# Patient Record
Sex: Female | Born: 1935 | Race: White | Hispanic: No | Marital: Single
Health system: Midwestern US, Academic
[De-identification: ages and names within clinical notes are randomized; demographics above are authoritative.]

---

## 2017-03-30 ENCOUNTER — Encounter: Admit: 2017-03-30 | Discharge: 2017-03-30 | Payer: MEDICARE

## 2017-03-30 MED ORDER — METOPROLOL TARTRATE 25 MG PO TAB
ORAL_TABLET | ORAL | 2 refills | 90.00000 days | Status: AC
Start: 2017-03-30 — End: 2018-07-06

## 2017-04-17 LAB — BASIC METABOLIC PANEL
Lab: 14
Lab: 140
Lab: 66
Lab: 77

## 2017-04-17 LAB — THYROID STIMULATING HORMONE-TSH: Lab: 1.9

## 2017-04-17 LAB — LIPID PROFILE
Lab: 126 — ABNORMAL HIGH (ref <100)
Lab: 196
Lab: 28
Lab: 4
Lab: 50

## 2017-06-02 ENCOUNTER — Encounter: Admit: 2017-06-02 | Discharge: 2017-06-02 | Payer: MEDICARE

## 2017-06-02 ENCOUNTER — Ambulatory Visit: Admit: 2017-06-02 | Discharge: 2017-06-03 | Payer: MEDICARE

## 2017-06-02 DIAGNOSIS — I10 Essential (primary) hypertension: ICD-10-CM

## 2017-06-02 DIAGNOSIS — I4891 Unspecified atrial fibrillation: Principal | ICD-10-CM

## 2017-06-02 DIAGNOSIS — I4819 Other persistent atrial fibrillation: Principal | ICD-10-CM

## 2017-06-02 DIAGNOSIS — E785 Hyperlipidemia, unspecified: ICD-10-CM

## 2017-06-02 DIAGNOSIS — E039 Hypothyroidism, unspecified: ICD-10-CM

## 2017-06-02 DIAGNOSIS — G20 Parkinson's disease: ICD-10-CM

## 2017-06-04 ENCOUNTER — Encounter: Admit: 2017-06-04 | Discharge: 2017-06-04 | Payer: MEDICARE

## 2017-10-28 LAB — COMPREHENSIVE METABOLIC PANEL
Lab: 0.8 — ABNORMAL LOW (ref 33.0–37.0)
Lab: 1
Lab: 103 — ABNORMAL HIGH (ref 37.0–47.0)
Lab: 142
Lab: 16 — ABNORMAL HIGH (ref 27.0–31.0)
Lab: 18 — ABNORMAL HIGH (ref 0–14)
Lab: 20
Lab: 26 — ABNORMAL HIGH (ref 80.0–99.0)
Lab: 34
Lab: 4.4
Lab: 7.1
Lab: 71
Lab: 88
Lab: 9.5
Lab: 97

## 2017-10-28 LAB — THYROID STIMULATING HORMONE-TSH: Lab: 2.8 — ABNORMAL HIGH (ref 12.0–16.0)

## 2017-10-28 LAB — CBC: Lab: 6.4

## 2018-07-06 ENCOUNTER — Encounter: Admit: 2018-07-06 | Discharge: 2018-07-06 | Payer: MEDICARE

## 2018-07-06 ENCOUNTER — Ambulatory Visit: Admit: 2018-07-06 | Discharge: 2018-07-07 | Payer: MEDICARE

## 2018-07-06 DIAGNOSIS — E039 Hypothyroidism, unspecified: ICD-10-CM

## 2018-07-06 DIAGNOSIS — E782 Mixed hyperlipidemia: ICD-10-CM

## 2018-07-06 DIAGNOSIS — E785 Hyperlipidemia, unspecified: ICD-10-CM

## 2018-07-06 DIAGNOSIS — I4821 Permanent atrial fibrillation: Principal | ICD-10-CM

## 2018-07-06 DIAGNOSIS — I4891 Unspecified atrial fibrillation: Principal | ICD-10-CM

## 2018-07-06 DIAGNOSIS — G20 Parkinson's disease: ICD-10-CM

## 2018-07-06 DIAGNOSIS — I10 Essential (primary) hypertension: ICD-10-CM

## 2018-07-06 LAB — BASIC METABOLIC PANEL
Lab: 18 — ABNORMAL HIGH (ref 0–14)
Lab: 1.6 — ABNORMAL HIGH (ref 0.57–1.11)
Lab: 119 — ABNORMAL HIGH (ref 83–110)
Lab: 142
Lab: 3.7
Lab: 31 — ABNORMAL HIGH (ref 80.0–99.0)
Lab: 37 — ABNORMAL HIGH (ref 9.8–20.1)
Lab: 9.8
Lab: 97 — ABNORMAL LOW (ref 98–107)

## 2018-07-06 LAB — BNP (B-TYPE NATRIURETIC PEPTI): Lab: 147 — ABNORMAL HIGH (ref 0–100)

## 2018-07-06 LAB — CBC: Lab: 7.5

## 2018-07-07 MED ORDER — APIXABAN 2.5 MG PO TAB
2.5 mg | ORAL_TABLET | Freq: Two times a day (BID) | ORAL | 11 refills | Status: AC
Start: 2018-07-07 — End: 2018-12-16

## 2018-07-08 ENCOUNTER — Encounter: Admit: 2018-07-08 | Discharge: 2018-07-08 | Payer: MEDICARE

## 2018-07-13 ENCOUNTER — Encounter: Admit: 2018-07-13 | Discharge: 2018-07-13 | Payer: MEDICARE

## 2018-08-03 ENCOUNTER — Ambulatory Visit: Admit: 2018-08-03 | Discharge: 2018-08-04 | Payer: MEDICARE

## 2018-08-03 ENCOUNTER — Encounter: Admit: 2018-08-03 | Discharge: 2018-08-03 | Payer: MEDICARE

## 2018-08-03 DIAGNOSIS — I10 Essential (primary) hypertension: ICD-10-CM

## 2018-08-03 DIAGNOSIS — E785 Hyperlipidemia, unspecified: ICD-10-CM

## 2018-08-03 DIAGNOSIS — I4891 Unspecified atrial fibrillation: Principal | ICD-10-CM

## 2018-08-03 DIAGNOSIS — E039 Hypothyroidism, unspecified: ICD-10-CM

## 2018-08-03 DIAGNOSIS — I4821 Permanent atrial fibrillation: Principal | ICD-10-CM

## 2018-08-03 DIAGNOSIS — E782 Mixed hyperlipidemia: ICD-10-CM

## 2018-08-05 ENCOUNTER — Encounter: Admit: 2018-08-05 | Discharge: 2018-08-05 | Payer: MEDICARE

## 2018-08-05 DIAGNOSIS — E785 Hyperlipidemia, unspecified: ICD-10-CM

## 2018-08-05 DIAGNOSIS — I4891 Unspecified atrial fibrillation: Principal | ICD-10-CM

## 2018-08-05 DIAGNOSIS — E039 Hypothyroidism, unspecified: ICD-10-CM

## 2018-08-10 ENCOUNTER — Ambulatory Visit: Admit: 2018-08-10 | Discharge: 2018-08-11 | Payer: MEDICARE

## 2018-08-10 ENCOUNTER — Encounter: Admit: 2018-08-10 | Discharge: 2018-08-10 | Payer: MEDICARE

## 2018-08-10 DIAGNOSIS — R079 Chest pain, unspecified: Principal | ICD-10-CM

## 2018-08-10 DIAGNOSIS — R600 Localized edema: ICD-10-CM

## 2018-08-10 DIAGNOSIS — I499 Cardiac arrhythmia, unspecified: ICD-10-CM

## 2018-08-10 IMAGING — US ECHOCOMPL
1 series · 14 of 24 positions shown · non-contrast
Comparison: none

[Series 1: us echo 2d, wo/w m-mode, compl · 99 acquisitions, 14 frames shown]
[im 1/99]
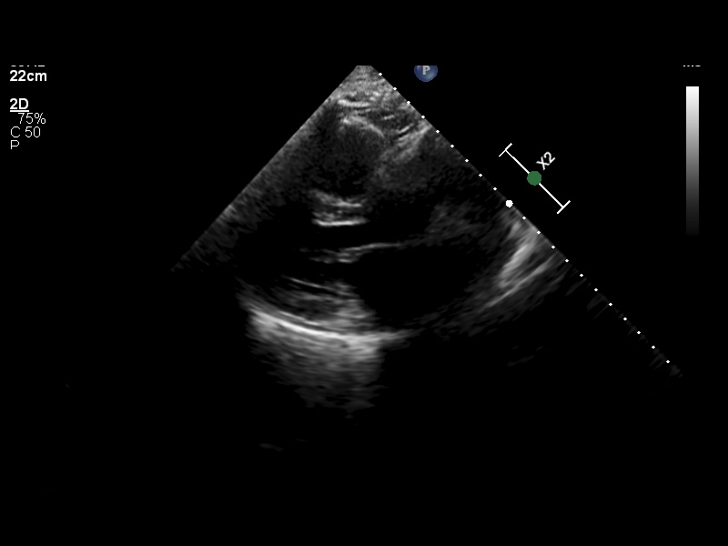
[im 5/99]
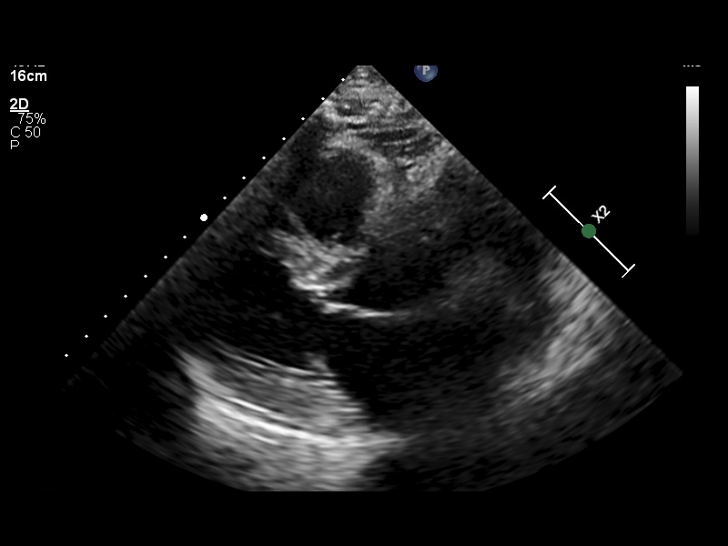
[im 13/99]
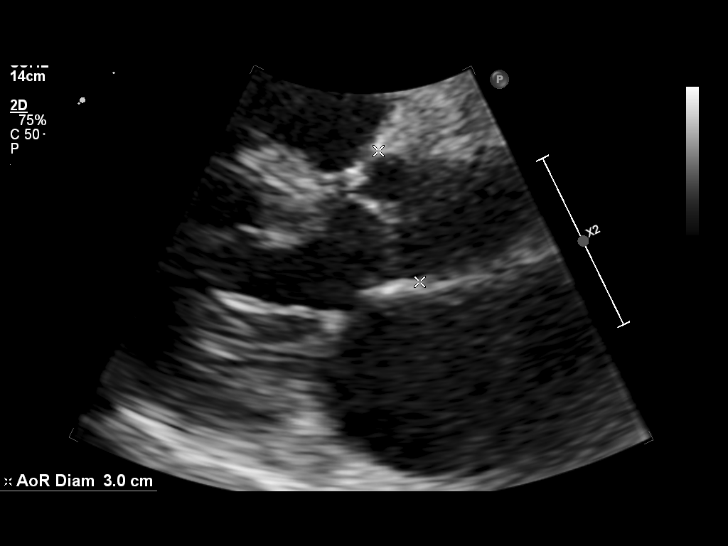
[im 26/99]
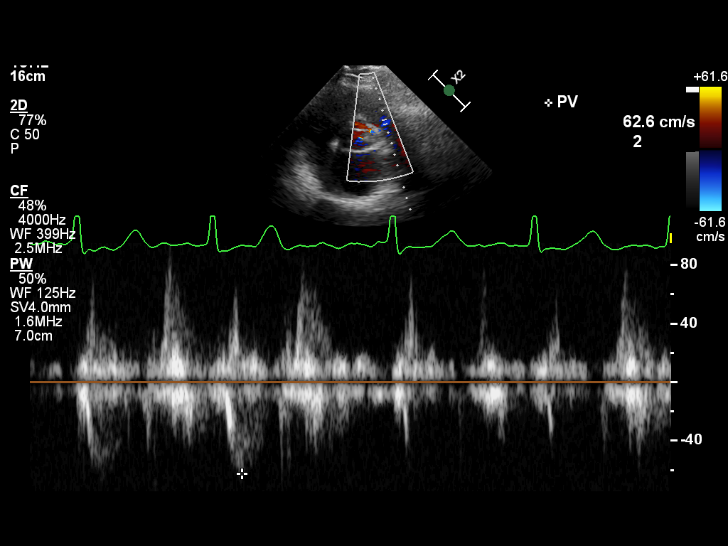
[im 30/99]
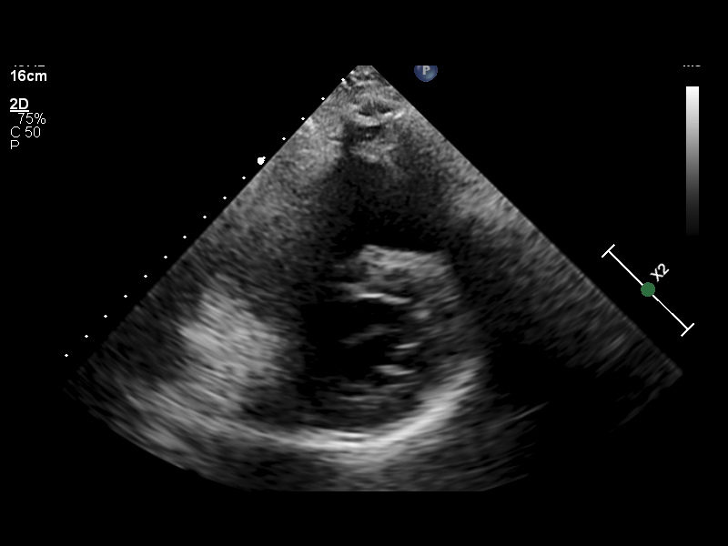
[im 39/99]
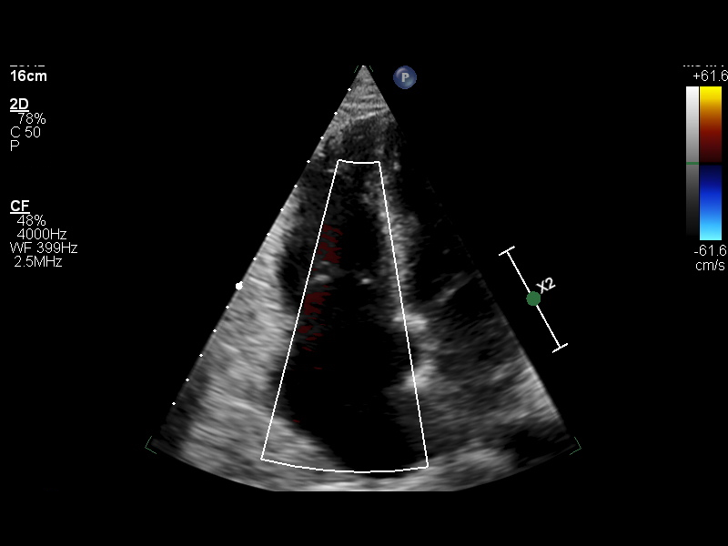
[im 47/99]
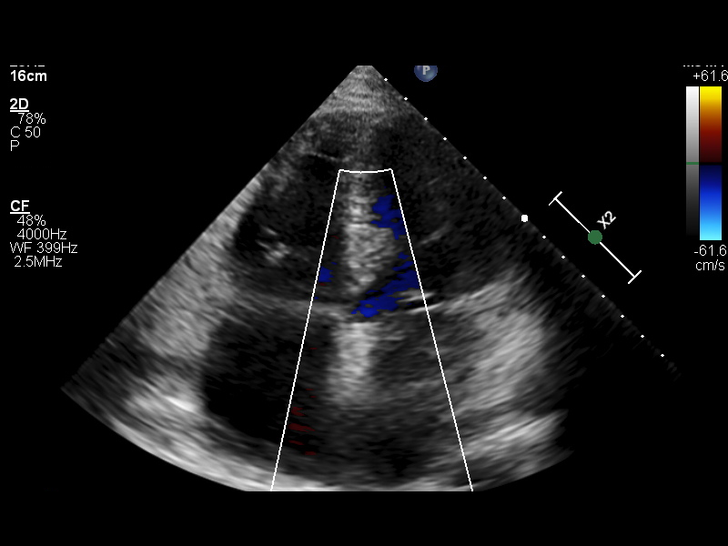
[im 47/99]
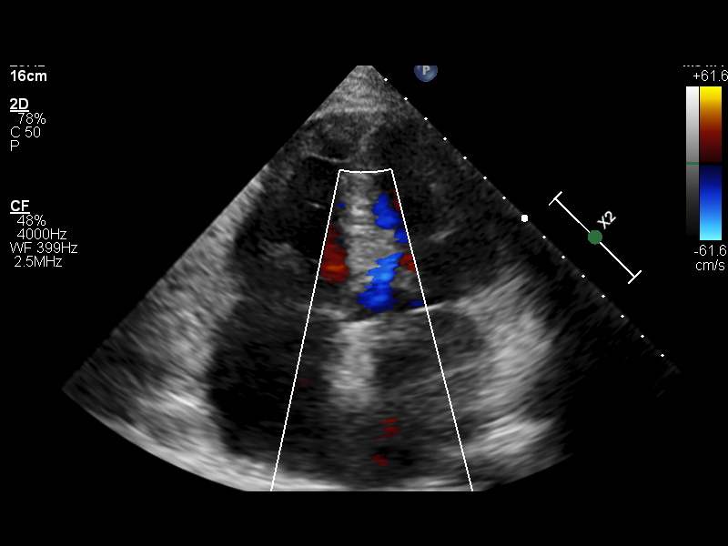
[im 60/99]
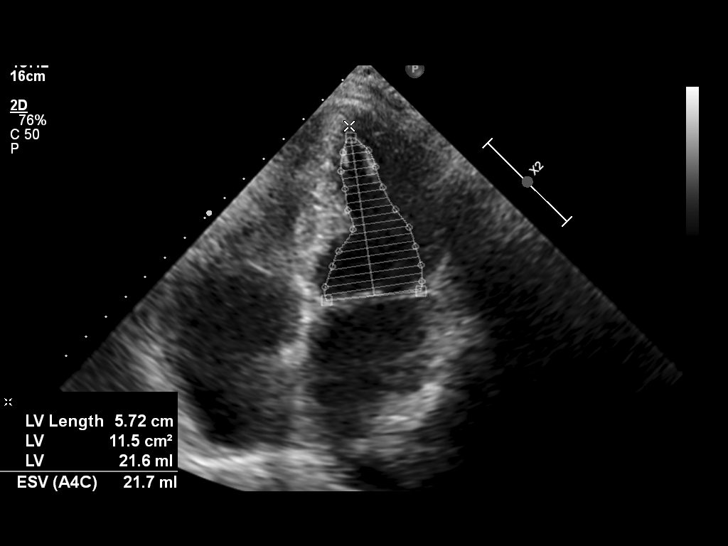
[im 69/99]
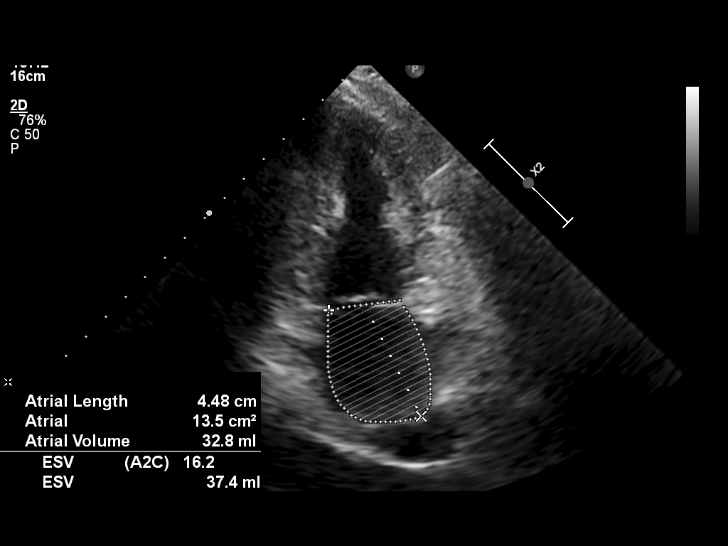
[im 77/99]
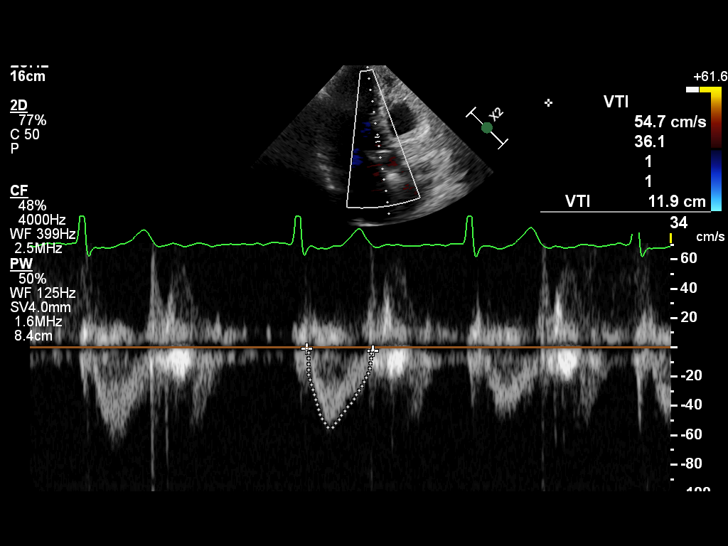
[im 81/99]
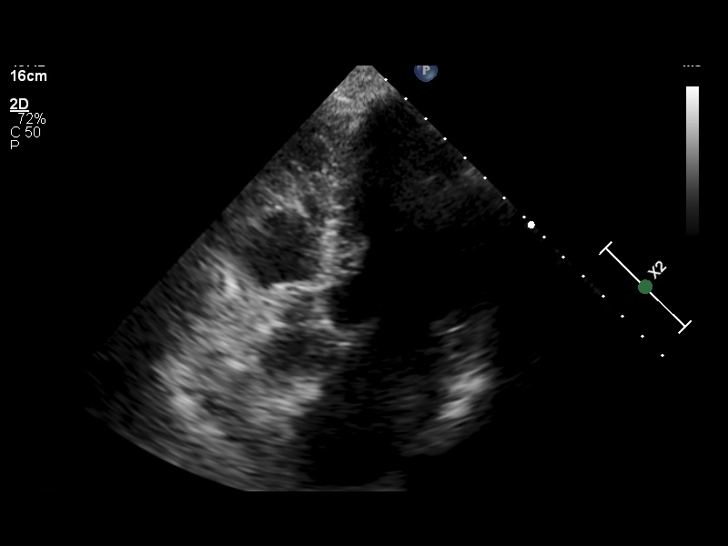
[im 90/99]
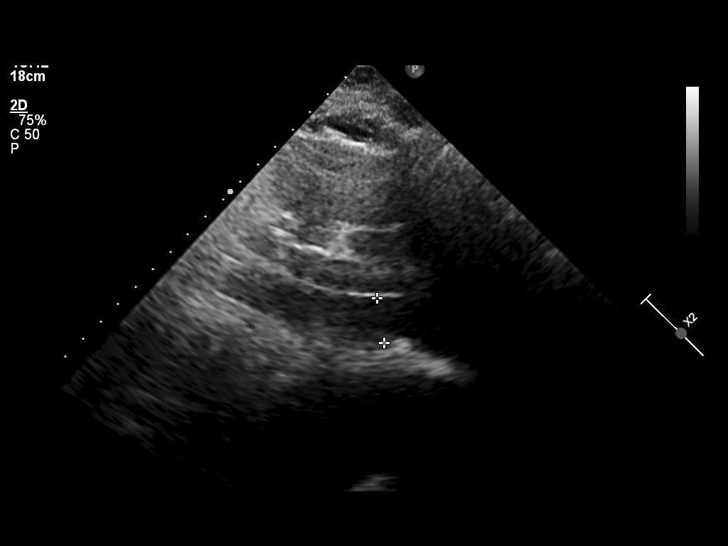
[im 99/99]
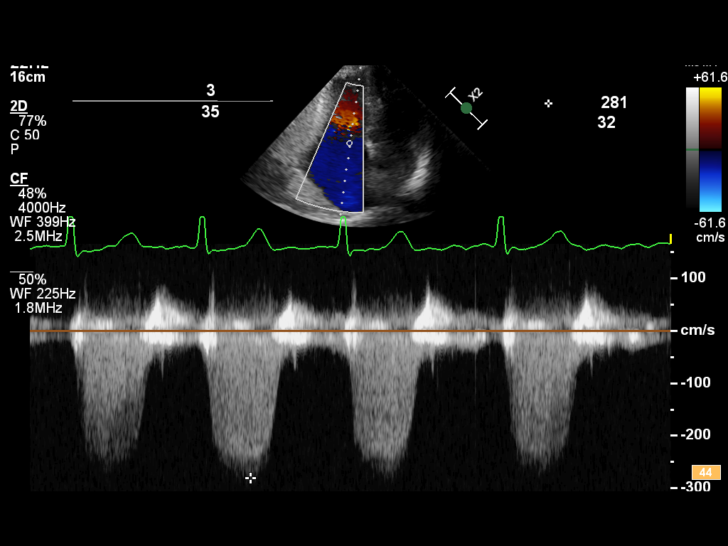

[14 of 24 positions shown; findings below may reference images not displayed]

FINAL REPORT IS SCANNED IN THE PATIENT'S EMR.

Tech Notes:

jl

## 2018-09-30 ENCOUNTER — Encounter: Admit: 2018-09-30 | Discharge: 2018-09-30 | Payer: MEDICARE

## 2018-12-16 ENCOUNTER — Encounter: Admit: 2018-12-16 | Discharge: 2018-12-16 | Payer: MEDICARE

## 2018-12-16 LAB — COMPREHENSIVE METABOLIC PANEL
Lab: 0.9
Lab: 1.1
Lab: 100
Lab: 28
Lab: 4.5
Lab: 47
Lab: 6
Lab: 7.3
Lab: 77
Lab: 141
Lab: 20
Lab: 28
Lab: 9.8
Lab: 90

## 2018-12-16 LAB — THYROID STIMULATING HORMONE-TSH
Lab: 0.2
Lab: 0

## 2018-12-16 LAB — CBC: Lab: 6.2

## 2018-12-16 MED ORDER — APIXABAN 5 MG PO TAB
5 mg | Freq: Two times a day (BID) | ORAL | 0 refills | Status: AC
Start: 2018-12-16 — End: ?

## 2018-12-16 NOTE — Progress Notes
Received fax from [facility],  [place], [place] [phone], Fx [phone]     Page w/ request for dose review from [facility], [place] [phone]  Fx [phone].     Pt is currently on Eliquis 2.5mg  bid, when it was prescribed in November 2019 Creat was 1.6. Prior to that it had been 0.8.     Creat on 10/27/18 is 1.17 (lab entered) so would be appropriate to increase to the 5mg  bid dose.     MAA reviewed, wrote on pharmacy form to change to 5mg  bid and repeat BMP in 1 mo. I faxed this information to the [facility].   Updated meds list    Lab entered for historical purpose.

## 2019-04-26 ENCOUNTER — Encounter: Admit: 2019-04-26 | Discharge: 2019-04-26

## 2019-05-03 ENCOUNTER — Ambulatory Visit: Admit: 2019-05-03 | Discharge: 2019-05-04

## 2019-05-03 ENCOUNTER — Encounter: Admit: 2019-05-03 | Discharge: 2019-05-03

## 2019-05-03 DIAGNOSIS — I4821 Permanent atrial fibrillation: Secondary | ICD-10-CM

## 2019-05-03 DIAGNOSIS — E039 Hypothyroidism, unspecified: Secondary | ICD-10-CM

## 2019-05-03 DIAGNOSIS — G20 Parkinson's disease: Secondary | ICD-10-CM

## 2019-05-03 DIAGNOSIS — E782 Mixed hyperlipidemia: Secondary | ICD-10-CM

## 2019-05-03 DIAGNOSIS — I4891 Unspecified atrial fibrillation: Secondary | ICD-10-CM

## 2019-05-03 DIAGNOSIS — I071 Rheumatic tricuspid insufficiency: Secondary | ICD-10-CM

## 2019-05-03 DIAGNOSIS — E785 Hyperlipidemia, unspecified: Secondary | ICD-10-CM

## 2019-05-03 DIAGNOSIS — I10 Essential (primary) hypertension: Secondary | ICD-10-CM

## 2019-09-13 ENCOUNTER — Encounter: Admit: 2019-09-13 | Discharge: 2019-09-13 | Payer: MEDICARE

## 2019-09-13 DIAGNOSIS — I4891 Unspecified atrial fibrillation: Secondary | ICD-10-CM

## 2019-09-13 DIAGNOSIS — I4821 Permanent atrial fibrillation: Secondary | ICD-10-CM

## 2019-09-13 DIAGNOSIS — E785 Hyperlipidemia, unspecified: Secondary | ICD-10-CM

## 2019-09-13 DIAGNOSIS — I10 Essential (primary) hypertension: Secondary | ICD-10-CM

## 2019-09-13 DIAGNOSIS — G20 Parkinson's disease: Secondary | ICD-10-CM

## 2019-09-13 DIAGNOSIS — E039 Hypothyroidism, unspecified: Secondary | ICD-10-CM

## 2019-09-13 DIAGNOSIS — Z7901 Long term (current) use of anticoagulants: Secondary | ICD-10-CM

## 2019-09-13 DIAGNOSIS — E782 Mixed hyperlipidemia: Secondary | ICD-10-CM

## 2019-09-13 DIAGNOSIS — I071 Rheumatic tricuspid insufficiency: Secondary | ICD-10-CM

## 2019-09-13 NOTE — Progress Notes
Date of Service: 09/13/2019    [facility] Durene Foyt is a 86 y.o. female.       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    She does have permanent atrial fibrillation, her rate is controlled with a beta-blocker.  She also has Parkinson's disease and Alzheimer's disease entities are currently treated with Sinemet as well as Aricept.    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  She has been able to do that without any difficulty whatsoever.    Patient does not have any documented history of coronary artery disease.    She was evaluated with an echocardiogram in December 2019, the left ventricular systolic function was normal, she was found at the time to have moderate tricuspid valve regurgitation, it was no other significant valvular abnormality.         Vitals:    09/13/19 1608   BP: 128/72   BP Source: Arm, Left Upper   Patient Position: Sitting   Pulse: 82   Temp: 36.7 ?C (98.1 ?F)   TempSrc: Oral   SpO2: 98%   Weight: 82.4 kg (181 lb 9.6 oz)   Height: 1.753 m (5' 9)   PainSc: Zero     Body mass index is 26.82 kg/m?Marland Kitchen     Past Medical History  Patient Active Problem List    Diagnosis Date Noted   ? Moderate tricuspid valve regurgitation 05/03/2019   ? Parkinson's disease (tremor, stiffness, slow motion, unstable posture) (HCC) 05/20/2016   ? Essential hypertension 06/06/2014   ? Atrial fibrillation (HCC) 12/16/2011   ? Hypothyroid 12/16/2011   ? Hyperlipemia 12/16/2011         Review of Systems   Constitution: Negative.   HENT: Negative.    Eyes: Negative.    Cardiovascular: Negative.    Respiratory: Negative.    Endocrine: Negative.    Hematologic/Lymphatic: Negative.    Skin: Negative.    Musculoskeletal: Negative.    Gastrointestinal: Negative. Genitourinary: Negative.    Neurological: Negative.    Psychiatric/Behavioral: Negative.    Allergic/Immunologic: Negative.        Physical Exam  General Appearance: normal in appearance  Skin: warm, moist, no ulcers or xanthomas  Eyes: conjunctivae and lids normal, pupils are equal and round  Lips & Oral Mucosa: no pallor or cyanosis  Neck Veins: neck veins are flat, neck veins are not distended  Chest Inspection: chest is normal in appearance  Respiratory Effort: breathing comfortably, no respiratory distress  Auscultation/Percussion: lungs clear to auscultation, no rales or rhonchi, no wheezing  Cardiac Rhythm: irregularly irregular rhythm and normal rate  Cardiac Auscultation: S1, S2 normal, no rub, no gallop  Murmurs: no murmur  Carotid Arteries: normal carotid upstroke bilaterally, no bruit  Abdominal Aorta: no abdominal aortic bruit  Lower Extremity Edema: no lower extremity edema  Abdominal Exam: soft, non-tender, no masses, bowel sounds normal  Liver & Spleen: no organomegaly  Neurologic Exam: neurological assessment grossly intact         Cardiovascular Studies  Twelve-lead EKG demonstrates atrial fibrillation, ventricular rate 88 bpm    Problems Addressed Today  Encounter Diagnoses   Name Primary?   ? Permanent atrial fibrillation (HCC) Yes   ?  Mixed hyperlipidemia    ? Essential hypertension    ? Moderate tricuspid valve regurgitation    ? Hypothyroidism, unspecified type    ? Parkinson's disease (tremor, stiffness, slow motion, unstable posture) (HCC)    ? On apixaban therapy        Assessment and Plan     In summary: Patient is an 86 year old retired [place] nun with a history of permanent atrial fibrillation, patient is on anticoagulation with apixaban, her heart rate is well controlled, other comorbidities include: Hyperlipidemia, Alzheimer disease and Parkinson's disease, moderate TR by an echocardiogram performed in December 2019.  Patient does not have any documented history of CAD.    Plan: 1.  Continue all current medications  2.  I did recommend low-sodium diet?today on the physical exam she did have bilateral lower extremity edema pretibially  3.  I also recommend weight reduction of 3-5 pounds  4.  Patient will be evaluated with an echocardiogram and office visit in April May 2021.         Current Medications (including today's revisions)  ? apixaban (ELIQUIS) 5 mg tablet Take one tablet by mouth twice daily.   ? aspirin 81 mg chewable tablet Take 81 mg by mouth daily.     ? calcium carbonate/vitamin D-3 (OSCAL-500+D) 1250 mg/200 unit tablet Take 1 tablet by mouth daily. Calcium Carb 1250mg  delivers 500mg  elemental Ca   ? carbidopa/levodopa (SINEMET) 25/100 mg tablet Take 1 tablet by mouth four times daily.   ? donepezil (ARICEPT) 10 mg tablet Take 10 mg by mouth at bedtime daily.   ? glucosamine 500 mg tab Take 1,500 mg by mouth daily with breakfast.   ? levothyroxine (SYNTHROID) 75 mcg tablet Take 75 mcg by mouth daily 30 minutes before breakfast.   ? memantine (NAMENDA) 5 mg tablet Take 5 mg by mouth twice daily.   ? metoprolol XL (TOPROL XL) 25 mg extended release tablet Take 25 mg by mouth daily.   ? potassium chloride SR (K-DUR) 20 mEq tablet Take 20 mEq by mouth daily. Take with a meal and a full glass of water.   ? rOPINIRole (REQUIP) 2 mg tablet Take 2 mg by mouth three times daily.   ? simvastatin (ZOCOR) 20 mg tablet Take 20 mg by mouth at bedtime daily.     ? tiZANidine (ZANAFLEX) 2 mg capsule Take 2 mg by mouth three times daily.   ? torsemide(+) (DEMADEX) 20 mg tablet Take 40 mg by mouth daily.   ? zinc sulfate 220 mg (50 mg elemental zinc) capsule Take 220 mg by mouth daily.

## 2019-12-12 IMAGING — US ECHOCOMPL
1 series · 13 of 24 positions shown · non-contrast
Comparison: none

[Series 1: us echo 2d, wo/w m-mode, compl · 39 acquisitions, 13 frames shown]
[im 1/39]
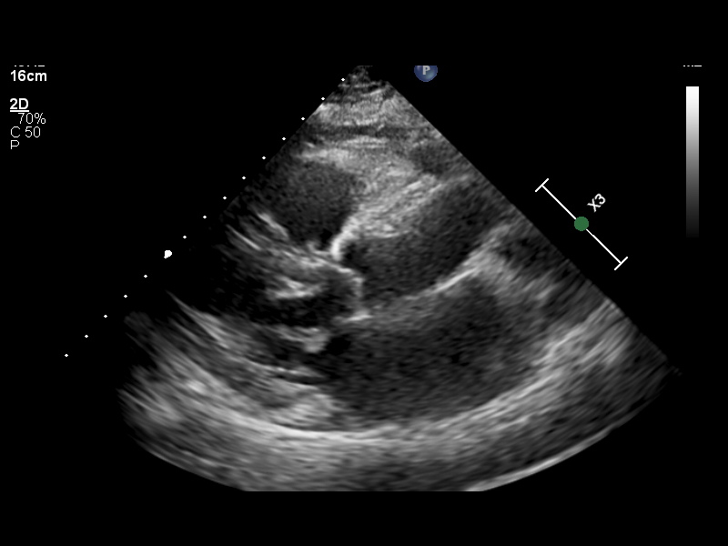
[im 2/39]
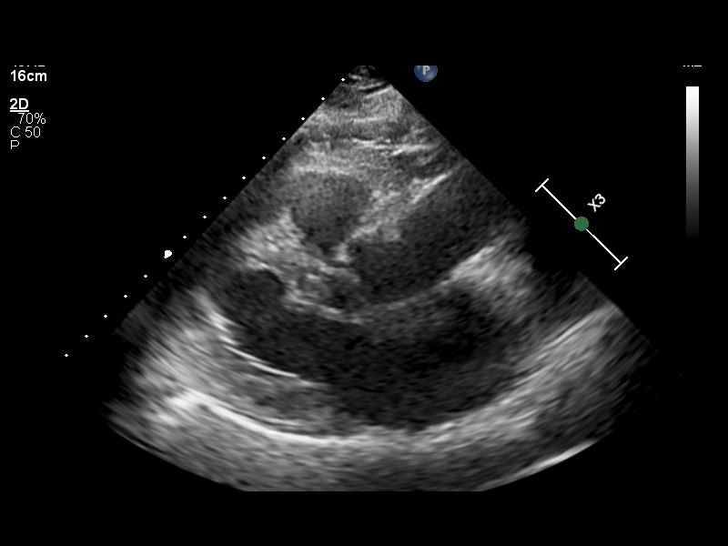
[im 5/39]
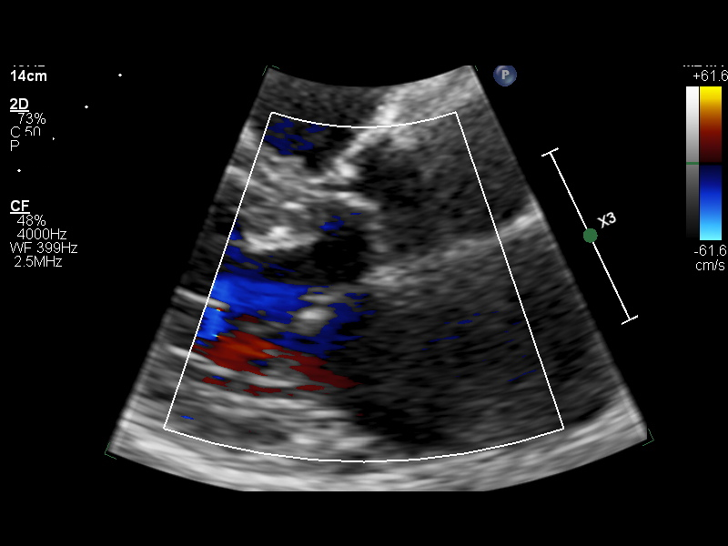
[im 9/39]
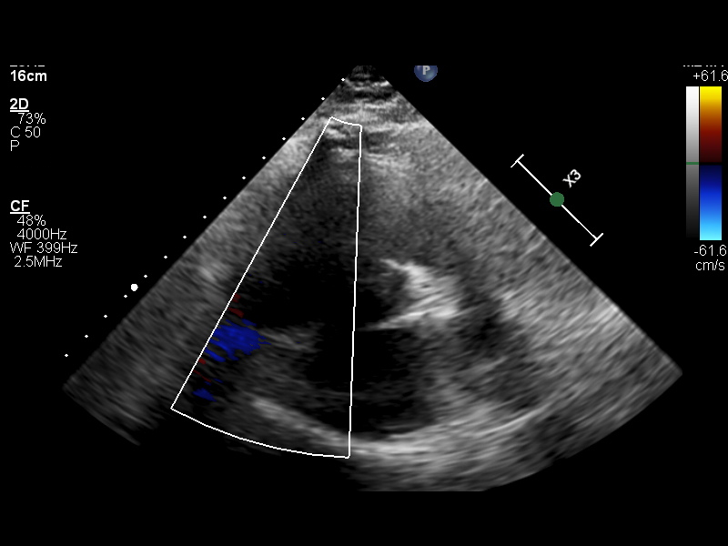
[im 12/39]
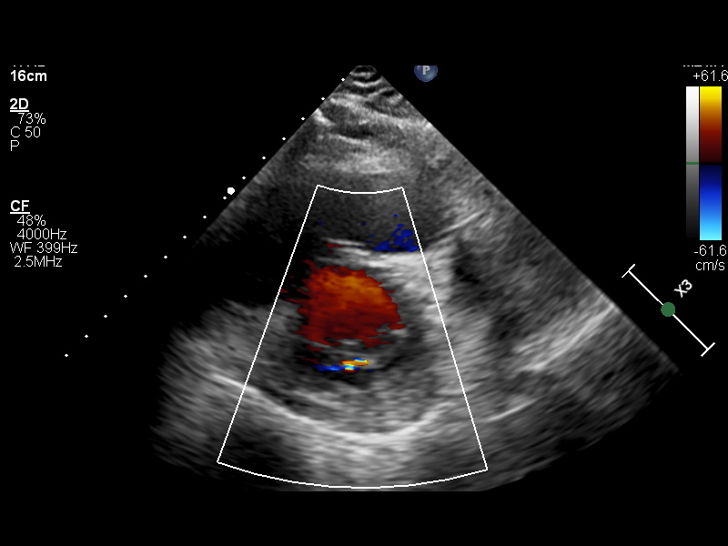
[im 15/39]
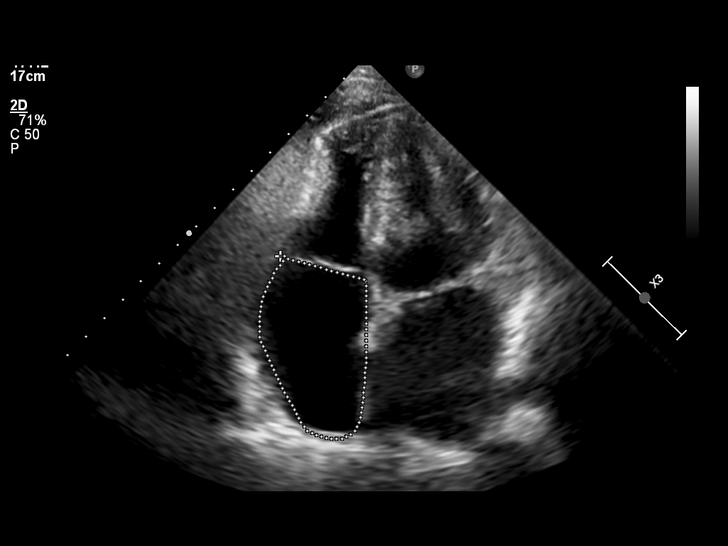
[im 19/39]
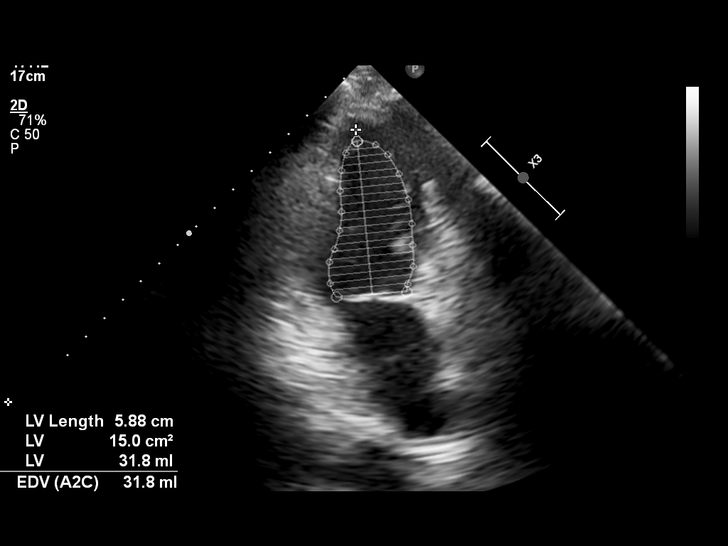
[im 22/39]
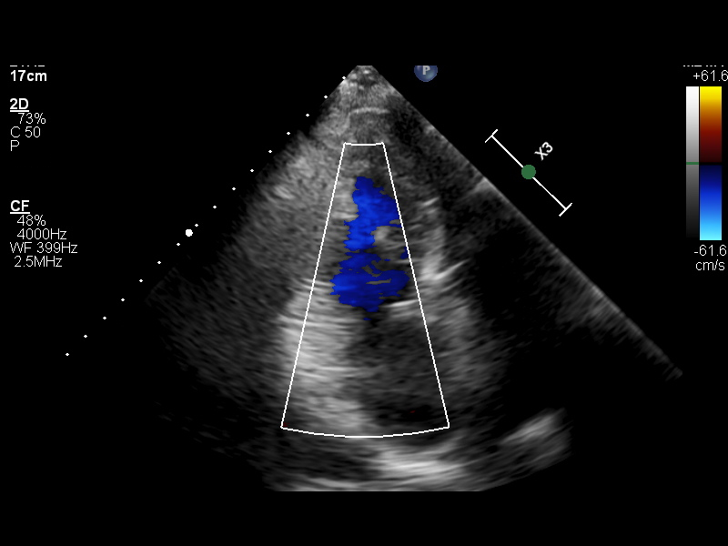
[im 24/39]
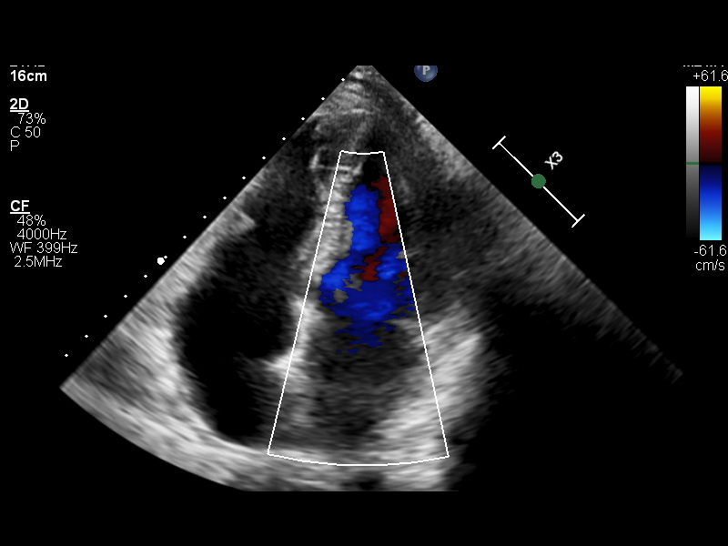
[im 27/39]
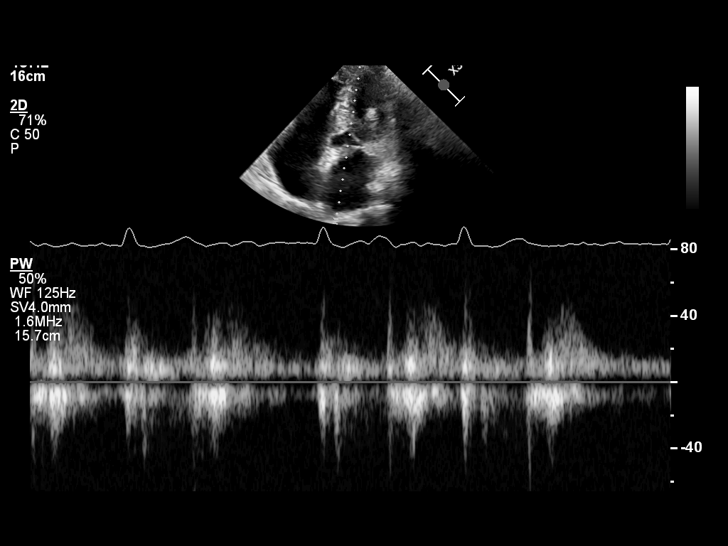
[im 30/39]
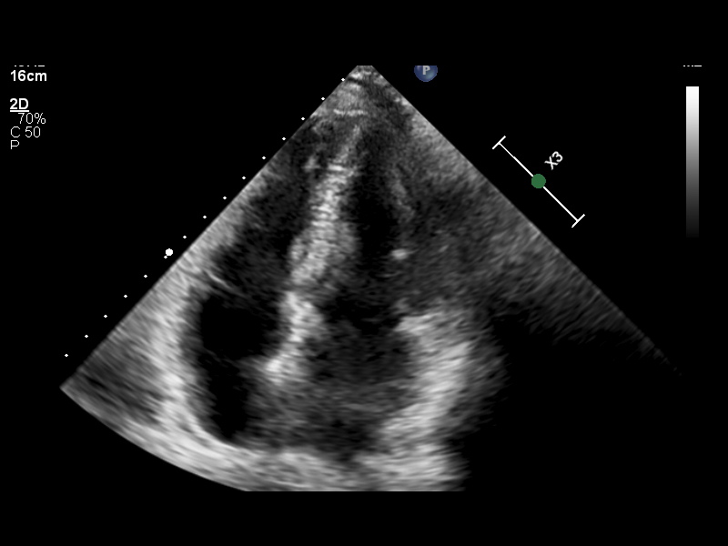
[im 34/39]
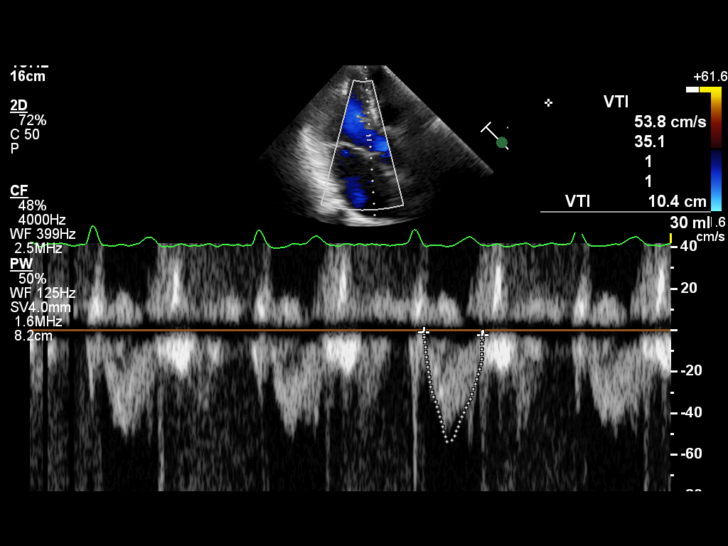
[im 39/39]
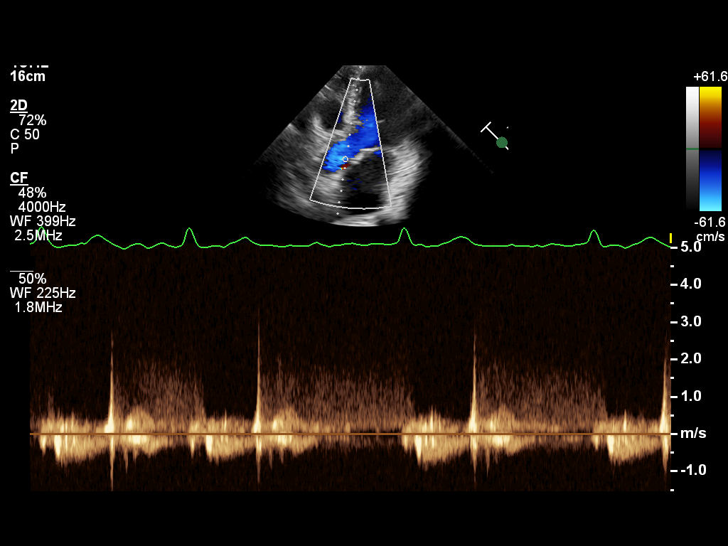

[13 of 24 positions shown; findings below may reference images not displayed]

12/12/19 -  2D + DOPPLER ECHO
Location Performed: [HOSPITAL]

Referring Provider: Jhon Elver Arrollave
Amazigh:
Location of Interp:
Sonographer: External Staff
Machine:  [employer]

Indications:      Hypertension     Hyperlipidemia, Moderate TR

Vitals
Height   Weight   BSA (Calculated)   BP   Comments
175.3 cm (69")   88.5 kg (195 lb)   2.07   118/69

Interpretation Summary
Normal left ventricular systolic function with an EF of 65%.
Moderate right atrial enlargement
Normal right ventricular size and function; RV s' 10 cm/s, TAPSE 12 mm
Estimated peak systolic PA pressure =  33 mmHg
Mild sclerosis of the aortic valve without stenosis.  Trace regurgitation
Mild to moderate tricuspid valve regurgitation
No pericardial effusion

No prior echocardiograms for comparison.

Echocardiographic Findings
Left Ventricle   The left ventricular systolic function is normal. The visually estimated ejection
fraction is 65%.
Right Ventricle   The right ventricular size, wall thickness and systolic function are normal.
Left Atrium   Normal size.
Right Atrium   Moderately dilated.
IVC/SVC   Elevated central venous pressure (5-10 mm Hg).
Mitral Valve   Normal valve structure. No stenosis. Trace regurgitation.
Tricuspid Valve   Normal valve structure. No stenosis. Mild to moderate regurgitation.
Aortic Valve   The valve has focal thickening. No stenosis. Trace regurgitation.
Pulmonary   The pulmonic valve was not well seen.
Aorta   The aortic root and the visualized portion of the proximal ascending aorta are normal in
diameter indexed for the patient's body surface area.
Pericardium   No pericardial effusion.

Left Heart 2D Measurements (Normal Ranges)
EF (Visual)
65 %
EF (Simpson's)
73 %
LVIDD
3.8 cm (Range: 3.8 - 5.2)
LVIDS
2.0 cm (Range: 2.2 - 3.5)
IVS
1.1 cm (Range: 0.6 - 0.9)
LV PW
1.2 cm (Range: 0.6 - 0.9)
LA Size
3.8 cm (Range: 2.7 - 3.8)

Right Heart 2D   M-Mode Measurements (Normal Ranges) (Range)
RV Basal Dia
3.7 cm (2.5 - 4.1)
RV Mid Dia
2.8 cm (1.9 - 3.5)
MAZUR
22.0 cm2 (<18)
M-Mode TAPSE
1.2 cm (>1.7)

Left Heart 2D Addnl Measurements (Normal Ranges)
LV Systolic Vol
10 mL (Range: 14 - 42)
LV Systolic Vol Index
5 mL (Range: 8 - 24)
LV Diastolic Vol
37 mL (Range: 46 - 106)
LV Diastolic Vol Index
18 mL (Range: 29 - 61)
LA Vol
38 mL (Range: 22 - 52)
LA Vol Index
18.36 (Range: 16 - 34)
LV Mass
144 g (Range: 67 - 162)
LV Mass Index
69 g/m2 (Range: 43 - 95)
RWT
0.63 (Range: <=0.42)

Aortic Root Measurements (Normal Ranges)
Sinus
3.2 cm (Range: 2.4 - 3.6)
ADONAY MAR
3.1 cm

Doppler (Spectral and Color Flow)
Estimated Peak Systolic PA Pressure
Aortic valve peak velocity
0.8 m/s
Aortic valve regurgitation pressure halftime
759 msec

Tech Notes:

## 2019-12-13 ENCOUNTER — Encounter: Admit: 2019-12-13 | Discharge: 2019-12-13 | Payer: MEDICARE

## 2019-12-19 NOTE — Telephone Encounter
-----   Message from Dorris Fetch, MD sent at 12/18/2019  5:48 PM CDT -----  Please call the patient and let her know that the echocardiogram showed normal heart function.Thank you  ----- Message -----  From: Marcell Barlow, MD  Sent: 12/13/2019   5:18 PM CDT  To: Dorris Fetch, MD

## 2019-12-19 NOTE — Telephone Encounter
Discussed echo results with Lafonda Mosses, charge nurse at patient's residential facility. She will speak with patient.

## 2020-04-30 ENCOUNTER — Encounter: Admit: 2020-04-30 | Discharge: 2020-04-30 | Payer: MEDICARE

## 2020-05-02 ENCOUNTER — Encounter: Admit: 2020-05-02 | Discharge: 2020-05-02 | Payer: MEDICARE

## 2020-05-15 ENCOUNTER — Encounter: Admit: 2020-05-15 | Discharge: 2020-05-15 | Payer: MEDICARE

## 2020-05-18 ENCOUNTER — Encounter: Admit: 2020-05-18 | Discharge: 2020-05-18 | Payer: MEDICARE

## 2020-05-18 DIAGNOSIS — E039 Hypothyroidism, unspecified: Secondary | ICD-10-CM

## 2020-05-18 DIAGNOSIS — E785 Hyperlipidemia, unspecified: Secondary | ICD-10-CM

## 2020-05-18 DIAGNOSIS — Z7901 Long term (current) use of anticoagulants: Secondary | ICD-10-CM

## 2020-05-18 DIAGNOSIS — I071 Rheumatic tricuspid insufficiency: Secondary | ICD-10-CM

## 2020-05-18 DIAGNOSIS — I10 Essential (primary) hypertension: Secondary | ICD-10-CM

## 2020-05-18 DIAGNOSIS — I4891 Unspecified atrial fibrillation: Secondary | ICD-10-CM

## 2020-05-18 DIAGNOSIS — I4821 Permanent atrial fibrillation: Secondary | ICD-10-CM

## 2020-05-18 DIAGNOSIS — E782 Mixed hyperlipidemia: Secondary | ICD-10-CM

## 2020-05-18 DIAGNOSIS — G20 Parkinson's disease: Secondary | ICD-10-CM

## 2020-05-18 NOTE — Progress Notes
Date of Service: 05/18/2020    [facility] Melinda Mcdaniel is a 86 y.o. female.       HPI     Melinda Mcdaniel was seen in our office today for routine evaluation.  She is accompanied today by her friend, Melinda Mcdaniel. 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 Nickolas Madrid.  The patient has a medical history significant for atrial fibrillation rate controlled with beta-blocker, hypertension, Parkinson's disease and Alzheimer's disease currently treated with Sinemet as well as Aricept.  Does not have any documented history of CAD.    Melinda Mcdaniel was last seen in our office on 09/13/2019 by Dr. Avie Arenas.  No changes were made in her medications.  She was recommended to be on a low-sodium diet and to try to lose 3 to 5 pounds.  She was asked to get an updated echo in April.    2D echo Doppler performed on 12/12/2019 shows normal left ventricular systolic function with EF of 65%, moderate right atrial enlargement.  Normal right ventricular size and systolic function.  PA systolic pressure 33 mmHg.  Mild aortic valve sclerosis without stenosis, trace regurgitation.  Mild to moderate tricuspid valve regurgitation.    Today the patient reports no complaints.  She tells me she is getting along quite well.  She remains physically active and tries to walk every day although she tells me she is not a power walker.  She is trying to get in almost 30 minutes of nonstop walking daily. She reports that she can walk either in the building or is able to walk outside weather  permitting.  She is able to get around without any difficulty whatsoever. She does complain of hip pain and had a recent steroid injection by Dr. Herschell Dimes who is the physician for [facility] health care facility where the patient resides in [place].  He denies having chest pain, shortness of breath, exertional dyspnea, PND, orthopnea, cough and lower extremity edema.  She denies having palpitations, racing heartbeats, tinnitus and lightheadedness.  She has not had any near syncope, syncopal episodes or falls.  She remains anticoagulated apixaban 5 mg twice daily, denies any bleeding problems or complications.  She is compliant taking her medications.  She tells me she is maintaining a low-sodium diet.  Blood pressure today 140/82.  Prior to discharge from the office her blood pressure was 128/72 manually.  Today she was 175 pounds which is down 6 pounds from her last visit in January.    Assessment and Plan:   1.  Atrial fibrillation.  Patient is on anticoagulation with apixaban.  Her heart rate is well controlled on metoprolol XL 25 mg daily.  Her last echo performed on 12/12/2019 shows normal left ventricular systolic function with mild to moderate TR.  2.  Hyperlipidemia.  Treated.  3.  Alzheimer's disease.  4.  Parkinson's disease.  5.  Pretension.  Blood pressure initially was initially a bit elevated today in the office but after approximately 20 today she was 175 pounds which is down 6 pounds from her weight in February of this year.  After approximately 20 minutes of rest I got a manual reading of 128/72.  Of note is that patient did not take her medications prior to her visit today.  I asked her to take  her medicines as soon as she returns home this morning.  6. Mild to moderate tricuspid valve regurgitation.  There has been  no progression of her tricuspid valve insufficiency.  7.  Lower extremity edema.  Controlled.  She does not have any peripheral edema on examination today.  She continues on torsemide 40 mg daily.    -Continue current medications.  Continue current medical therapy  ?Asked patient to check blood pressure daily and keep a diary.  ?Reviewed discussed blood pressure goals, target less than 135/80.  ?Instructed to call our office in 2 weeks if she is often having systolic readings above 140 or diastolic above 90.  -Risk factors modification.  ?Stressed importance of a low sodium diet.   -Continue regular exercise.  Encouraged patient continue to get at least 30 minutes of walking/moderate aerobic exercise 5 days a week.  -Applauded patient for her weight reduction encouraged her to try to lose about 3 more  pounds.    Follow Up: Follow-up with Dr. Avie Arenas in 6  months.  Patient is encouraged to contact our office if she has problems prior to next visit    I have educated the patient on the plan of care today.  Patient verbally expressed understanding and agreement with plan.  Instructions are outlined in the after visit summary document.     Thank you for the opportunity to participate in this pleasant patient's care. Please don't hesitate to contact me with any concerns or questions.        DISEASE PREVENTION:  Lipids:Treated. On simvastatin 20 mg daily. No known CAD or diabetes.  Lipid profile 02/27/2020: Total cholesterol 195, triglycerides 187, HDL 53, LDL 105.Target LDL < 100, triglycerides < 150.       HTN:  Controlled.  Blood pressure today 140/82.  Blood pressure upon recheck approximately 15 minutes later was 128/72.     Diabetes No.      Tobacco: Denies use.      Obesity: BMI 25.9. A heart healthy diet and routine aerobic exercise are recommended.        Patient  education/counseling today: Reviewed medication instructions, medication interactions, treatment options, reviewed records including most recent lab and cardiac test results, lipid goals, diet, exercise, blood pressure monitoring, blood pressure goals,, weight reduction, follow-up plan.                         DRB  Vitals:    05/18/20 0759 05/18/20 0812   BP: (!) 144/84 (!) 140/82   BP Source: Arm, Left Upper Arm, Right Upper   Patient Position: Sitting Sitting   Pulse: 64    SpO2: 98%    Weight: 79.7 kg (175 lb 12.8 oz)    Height: 1.753 m (5' 9)    PainSc: Zero      Body mass index is 25.96 kg/m?Marland Kitchen     Past Medical History  Patient Active Problem List    Diagnosis Date Noted   ? On apixaban therapy 09/13/2019   ? Moderate tricuspid valve regurgitation 05/03/2019   ? Parkinson's disease (tremor, stiffness, slow motion, unstable posture) (HCC) 05/20/2016   ? Essential hypertension 06/06/2014   ? Atrial fibrillation (HCC) 12/16/2011   ? Hypothyroid 12/16/2011   ? Hyperlipemia 12/16/2011         Review of Systems   Constitutional: Negative.   HENT: Negative.    Eyes: Negative.    Cardiovascular: Negative.    Respiratory: Negative.    Endocrine: Negative.    Hematologic/Lymphatic: Negative.    Skin: Negative.    Musculoskeletal: Positive for joint pain and myalgias.  Gastrointestinal: Negative.    Genitourinary: Negative.    Neurological: Positive for tremors.   Psychiatric/Behavioral: Negative.    Allergic/Immunologic: Negative.        Physical Exam  Vital signs were reviewed.   General Appearance:appears well nourished, appears relaxed, in no acute distress,wearing a face mask  Skin: warm, moist, intact, no rash or lesions, no xanthomas, horizontal neck scar from previous thyroidectomy  HEENT: unremarkable, pupils equal and round, no scleral icterus, conjunctivae and lids normal  Lips & Mouth: no pallor or cyanosis  Neck Veins:  BP normal, neck veins are flat, neck veins are not distended   Carotid Arteries: normal carotid upstroke bilaterally, no bruits bilaterally  Chest Inspection: chest is normal in appearance  Auscultation/Percussion/Effort: lungs clear to auscultation, no rales, rhonchi, or wheezing, respirations even and unlabored, no respiratory distress  Cardiac Rhythm:  irregularly irregular rhythm and normal rate  Cardiac Auscultation: normal S1 & S2, no S3 or S4, no rub   Murmurs: no cardiac murmurs   Extremities: no lower extremity edema bilaterally, 2+ symmetric distal pulses   Abdominal Exam: soft, non-tender,non-distended, no obvious masses, bowel sounds normal, no guarding  Liver & Spleen: no organomegaly   Neurologic Exam: grossly intact, alert, moves all extremities equally   Orientation: oriented to time, person and place, clear historian  Gait: normal, steady, walks without assistance  Language & Memory: speech clear, patient responsive, seems to comprehend information                   Problems Addressed Today  Encounter Diagnoses   Name Primary?   ? Permanent atrial fibrillation (HCC) Yes   ? Mixed hyperlipidemia    ? Essential hypertension    ? Moderate tricuspid valve regurgitation    ? Hypothyroidism, unspecified type    ? Parkinson's disease (tremor, stiffness, slow motion, unstable posture) (HCC)    ? On apixaban therapy                      Current Medications (including today's revisions)  ? apixaban (ELIQUIS) 5 mg tablet Take one tablet by mouth twice daily.   ? aspirin 81 mg chewable tablet Take 81 mg by mouth daily.     ? calcium carbonate/vitamin D-3 (OSCAL-500+D) 1250 mg/200 unit tablet Take 1 tablet by mouth daily. Calcium Carb 1250mg  delivers 500mg  elemental Ca   ? carbidopa/levodopa (SINEMET) 25/100 mg tablet Take 1 tablet by mouth four times daily.   ? cholecalciferol (VITAMIN D-3) 5000 unit tablet Take 5,000 Units by mouth daily.   ? Docusate Sodium 100 mg tab Take 100 mg by mouth daily.   ? donepezil (ARICEPT) 10 mg tablet Take 10 mg by mouth at bedtime daily.   ? glucosamine 500 mg tab Take 1,500 mg by mouth daily with breakfast.   ? levothyroxine (SYNTHROID) 75 mcg tablet Take 75 mcg by mouth daily 30 minutes before breakfast.   ? memantine (NAMENDA) 5 mg tablet Take 5 mg by mouth twice daily.   ? metoprolol XL (TOPROL XL) 25 mg extended release tablet Take 25 mg by mouth daily.   ? potassium chloride SR (K-DUR) 20 mEq tablet Take 20 mEq by mouth daily. Take with a meal and a full glass of water.   ? rOPINIRole (REQUIP) 2 mg tablet Take 2 mg by mouth three times daily.   ? simvastatin (ZOCOR) 20 mg tablet Take 20 mg by mouth at bedtime daily.     ? torsemide(+) (DEMADEX) 20  mg tablet Take 40 mg by mouth daily.

## 2020-05-24 IMAGING — CR PELVIS
3 series · 3 of 3 positions shown · non-contrast
Comparison: none

[pelvis]
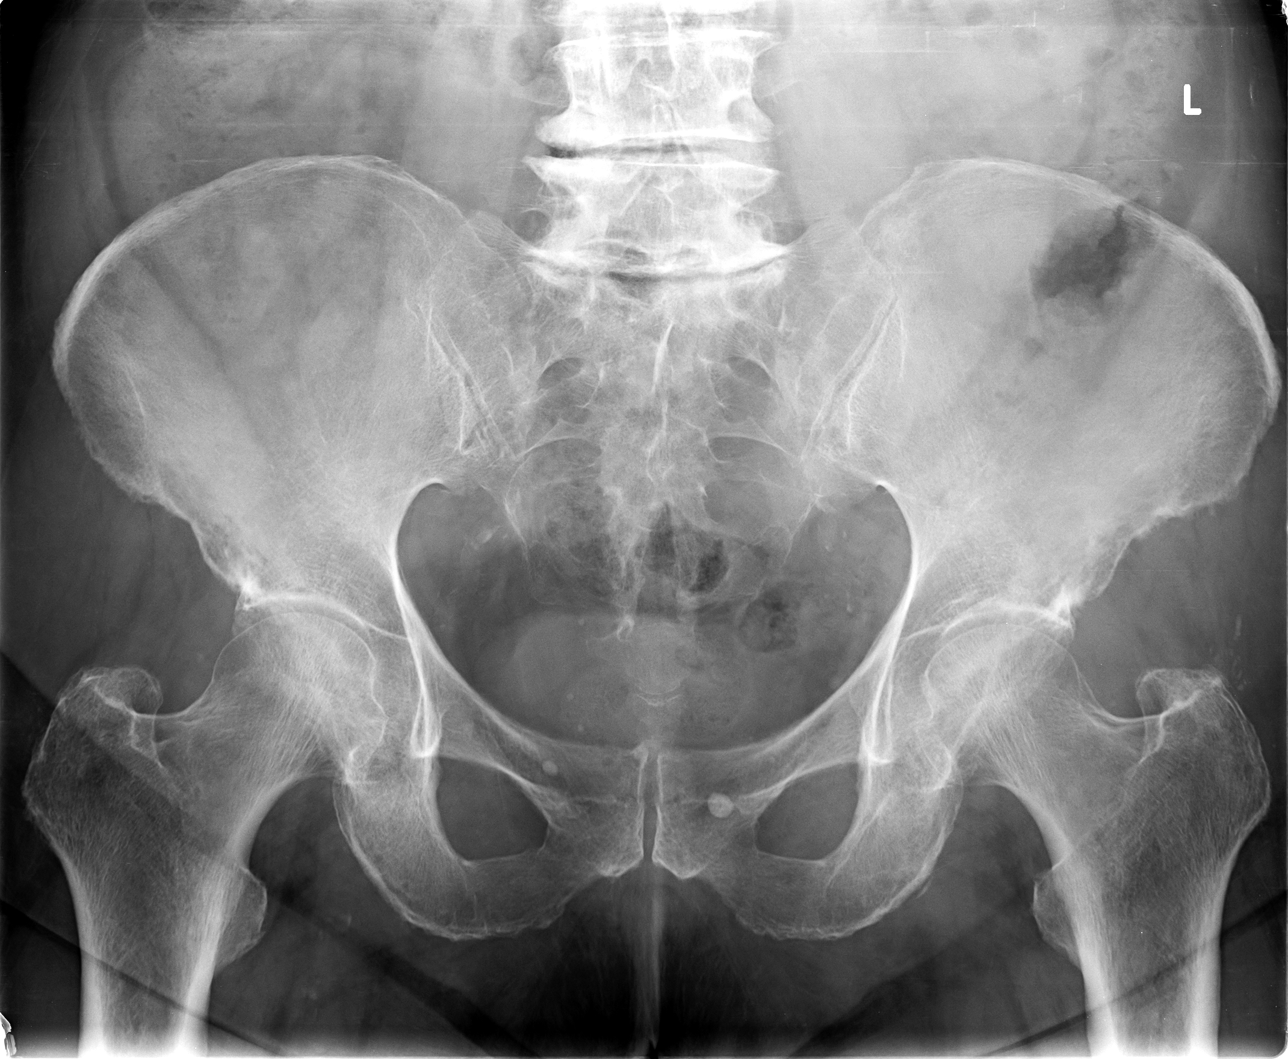

[hip ap]
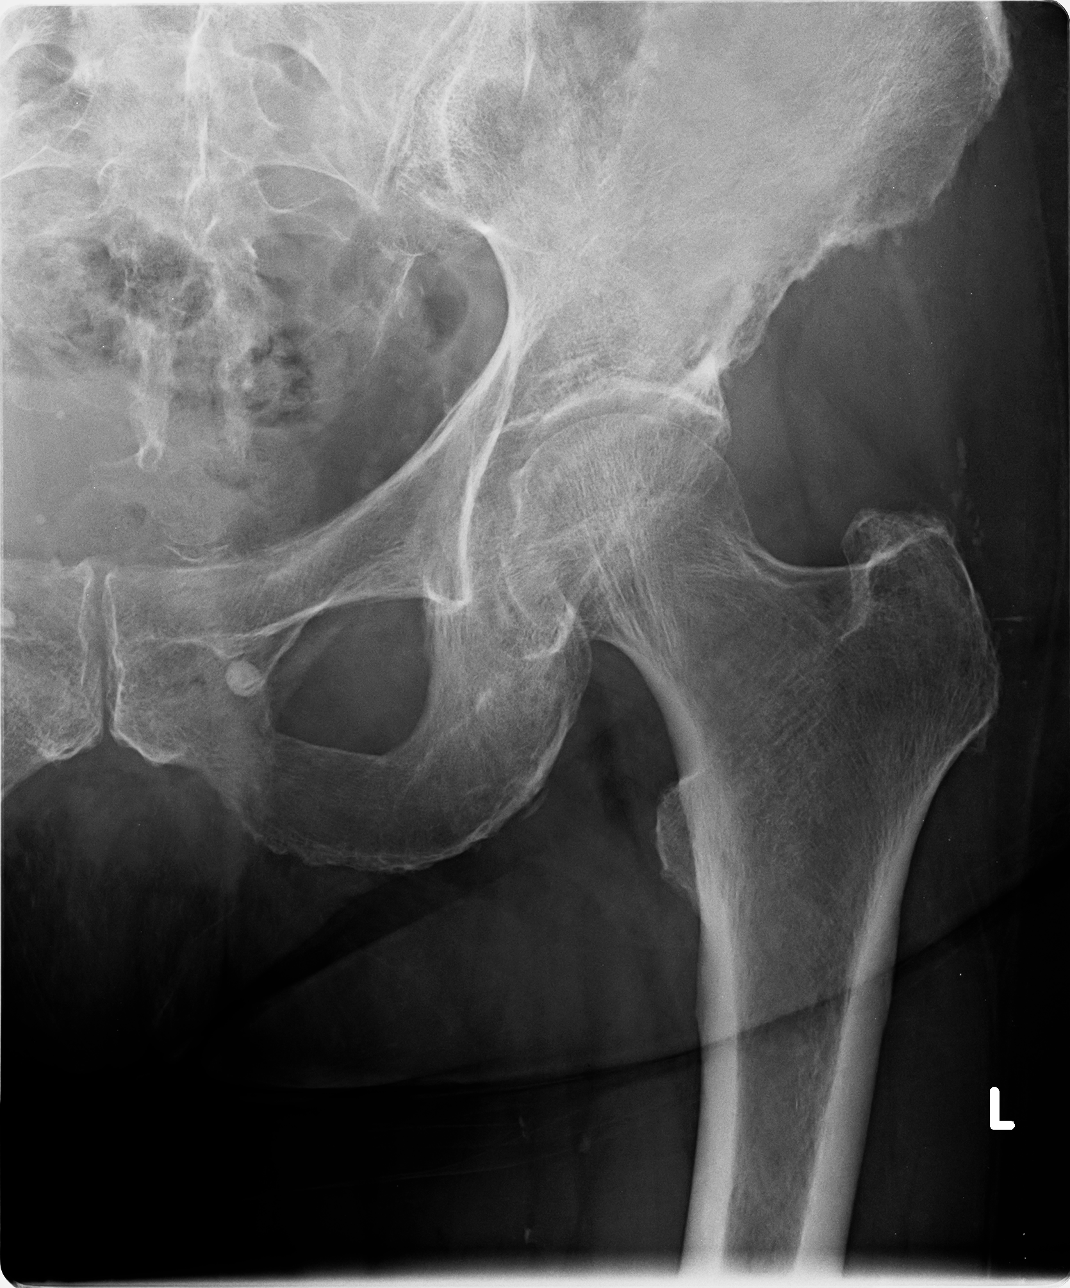

[hip frog]
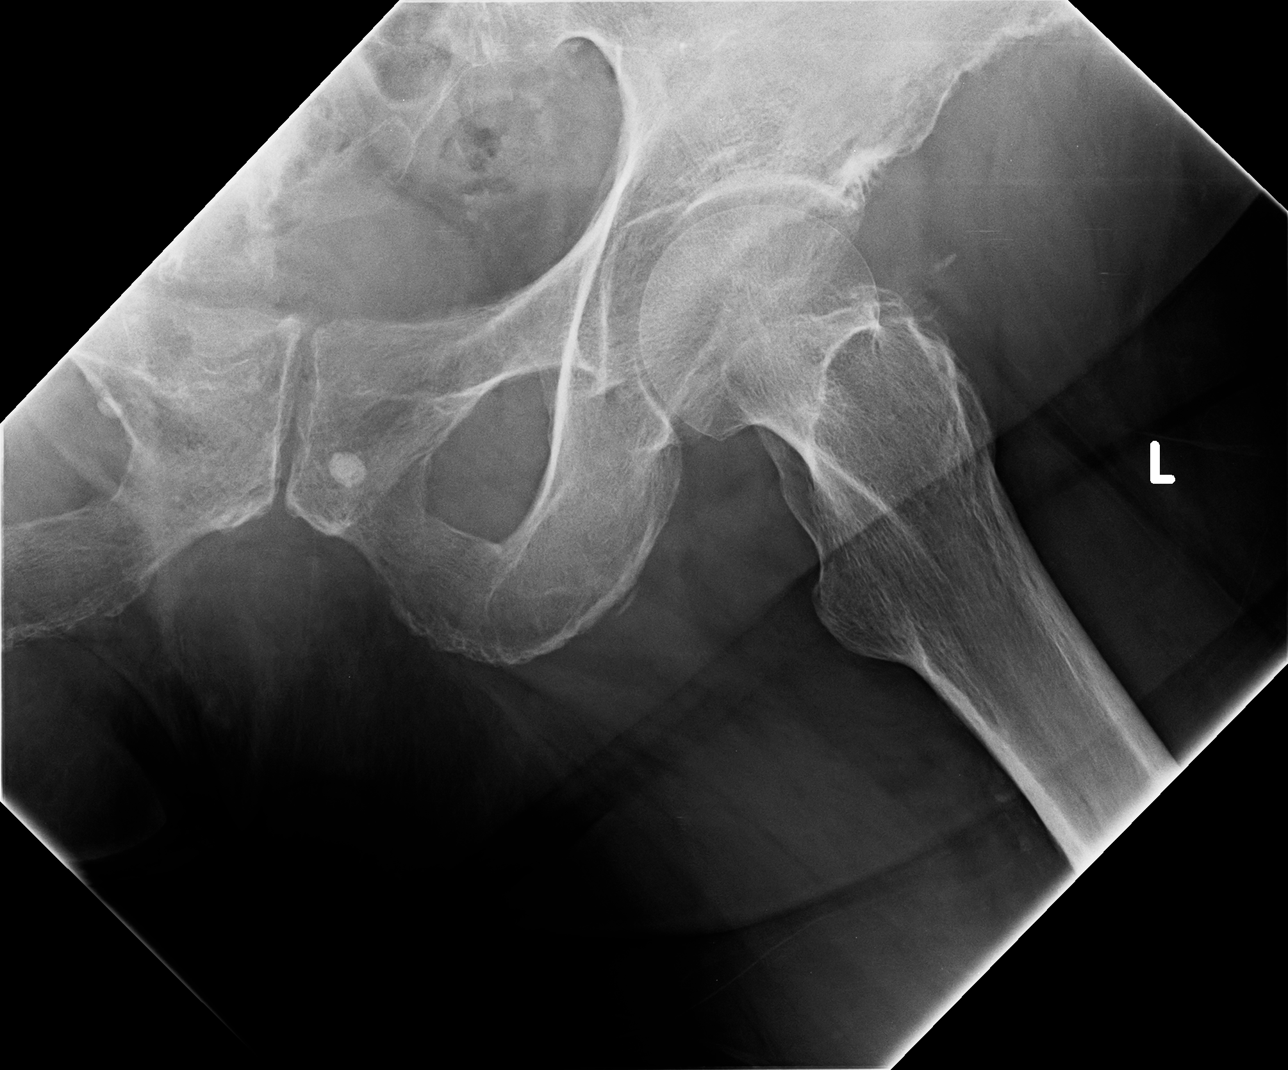

[3 of 3 positions shown; findings below may reference images not displayed]

DIAGNOSTIC STUDIES

EXAM

XR hip LT, 2-3V w or wo pelvis

INDICATION

l hip pain
Chronic pain. No known injury. AB

TECHNIQUE

AP pelvis AP and lateral views left hip

COMPARISONS

April 21, 2018

FINDINGS

Mild loss of articular cartilage is seen. No fractures are evident. There are degenerative changes
of the spine and sacroiliac joints. Calcifications the pelvis are likely vascular.

IMPRESSION

Mild loss of articular cartilage. No fractures are seen.

Degenerative changes of the lower lumbar spine and sacroiliac joints.

Tech Notes:

Chronic pain. No known injury. AB

## 2020-11-29 ENCOUNTER — Encounter: Admit: 2020-11-29 | Discharge: 2020-11-29 | Payer: MEDICARE

## 2020-11-29 DIAGNOSIS — E785 Hyperlipidemia, unspecified: Secondary | ICD-10-CM

## 2020-11-29 DIAGNOSIS — E039 Hypothyroidism, unspecified: Secondary | ICD-10-CM

## 2020-11-29 DIAGNOSIS — M159 Polyosteoarthritis, unspecified: Secondary | ICD-10-CM

## 2020-11-29 DIAGNOSIS — I4821 Permanent atrial fibrillation: Secondary | ICD-10-CM

## 2020-11-29 DIAGNOSIS — Z9181 History of falling: Secondary | ICD-10-CM

## 2020-11-29 DIAGNOSIS — I10 Essential (primary) hypertension: Secondary | ICD-10-CM

## 2020-11-29 DIAGNOSIS — R000 Tachycardia, unspecified: Secondary | ICD-10-CM

## 2020-11-29 DIAGNOSIS — E782 Mixed hyperlipidemia: Secondary | ICD-10-CM

## 2020-11-29 DIAGNOSIS — I4891 Unspecified atrial fibrillation: Secondary | ICD-10-CM

## 2020-11-29 DIAGNOSIS — G20 Parkinson's disease: Secondary | ICD-10-CM

## 2020-11-29 DIAGNOSIS — Z7901 Long term (current) use of anticoagulants: Secondary | ICD-10-CM

## 2020-11-29 DIAGNOSIS — G309 Alzheimer's disease, unspecified: Secondary | ICD-10-CM

## 2020-11-29 DIAGNOSIS — I071 Rheumatic tricuspid insufficiency: Secondary | ICD-10-CM

## 2020-11-29 MED ORDER — METOPROLOL SUCCINATE 25 MG PO TB24
12.5 mg | ORAL_TABLET | Freq: Two times a day (BID) | ORAL | 3 refills | 90.00000 days | Status: AC
Start: 2020-11-29 — End: ?

## 2020-11-29 NOTE — Progress Notes
Date of Service: 11/29/2020    [facility] Melinda Mcdaniel is a 87 y.o. female.       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    Patient's blood pressure today was mildly elevated, with the record 142/78 mmHg in the left upper extremity and 138/70 8 m of mercury in the right upper extremity, by EKG the ventricular rate was 103 bpm.  Patient is known to have permanent atrial fibrillation.  She is anticoagulated with apixaban.    She does not report having any symptoms of chest pain, no heart palpitations, no presyncope or syncope, she is asymptomatic with these underlying tachycardia.    Patient is known to have normal LV function, the last echocardiogram performed in May 2021 revealed an ejection fraction of 65%, on that study the RV function was normal, mildly elevated pulmonary artery pressure, estimated PAP =33 mmHg, mild to moderate mitral valve regurgitation.    Patient appears to be at risk for but she has not recorded any falls so far.  She does not use a cane for ambulation.       Vitals:    11/29/20 1006 11/29/20 1012   BP: (!) 142/78 138/78   BP Source: Arm, Left Upper Arm, Right Upper   Patient Position: Sitting Sitting   Pulse: 95    SpO2: 98%    Weight: 84.4 kg (186 lb)    Height: 175.3 cm (5' 9)    PainSc: Zero      Body mass index is 27.47 kg/m?Marland Kitchen     Past Medical History  Patient Active Problem List    Diagnosis Date Noted   ? On apixaban therapy 09/13/2019   ? Moderate tricuspid valve regurgitation 05/03/2019   ? Parkinson's disease (tremor, stiffness, slow motion, unstable posture) (HCC) 05/20/2016   ? Essential hypertension 06/06/2014   ? Atrial fibrillation (HCC) 12/16/2011   ? Hypothyroid 12/16/2011   ? Hyperlipemia 12/16/2011         Review of Systems   Constitutional: Negative.   HENT: Negative.    Eyes: Negative. Cardiovascular: Negative.    Respiratory: Negative.    Endocrine: Negative.    Hematologic/Lymphatic: Negative.    Skin: Negative.    Musculoskeletal: Negative.    Gastrointestinal: Negative.    Genitourinary: Negative.    Neurological: Negative.    Psychiatric/Behavioral: Negative.    Allergic/Immunologic: Negative.        Physical Exam  General Appearance: normal in appearance  Skin: warm, moist, no ulcers or xanthomas  Eyes: conjunctivae and lids normal, pupils are equal and round  Lips & Oral Mucosa: no pallor or cyanosis  Neck Veins: neck veins are flat, neck veins are not distended  Chest Inspection: chest is normal in appearance  Respiratory Effort: breathing comfortably, no respiratory distress  Auscultation/Percussion: lungs clear to auscultation, no rales or rhonchi, no wheezing  Cardiac Rhythm: irregularly irregular rhythm and normal rate  Cardiac Auscultation: S1, S2 normal, no rub, no gallop  Murmurs: no murmur  Carotid Arteries: normal carotid upstroke bilaterally, no bruit  Abdominal Aorta: no abdominal aortic bruit  Lower Extremity Edema: no lower extremity edema  Abdominal Exam: soft, non-tender, no masses, bowel sounds normal  Liver & Spleen: no organomegaly  Neurologic Exam: neurological assessment grossly intact      Cardiovascular Studies  Twelve-lead EKG demonstrates atrial fibrillation,  ventricular rate 103 bpm    Cardiovascular Health Factors  Vitals BP Readings from Last 3 Encounters:   11/29/20 138/78   05/18/20 (!) 140/82   12/13/19 118/69     Wt Readings from Last 3 Encounters:   11/29/20 84.4 kg (186 lb)   05/18/20 79.7 kg (175 lb 12.8 oz)   12/13/19 88.5 kg (195 lb)     BMI Readings from Last 3 Encounters:   11/29/20 27.47 kg/m?   05/18/20 25.96 kg/m?   12/13/19 28.80 kg/m?      Smoking Social History     Tobacco Use   Smoking Status Never Smoker   Smokeless Tobacco Never Used      Lipid Profile Cholesterol   Date Value Ref Range Status   02/27/2020 195  Final     HDL   Date Value Ref Range Status   02/27/2020 53  Final     LDL   Date Value Ref Range Status   02/27/2020 105 (H) <100 Final     Triglycerides   Date Value Ref Range Status   02/27/2020 187 (H) <150 Final      Blood Sugar No results found for: HGBA1C  Glucose   Date Value Ref Range Status   02/27/2020 92  Final   01/14/2019 96  Final   10/27/2018 90  Final          Problems Addressed Today  Encounter Diagnoses   Name Primary?   ? Permanent atrial fibrillation (HCC) Yes   ? Mixed hyperlipidemia    ? Essential hypertension    ? Moderate tricuspid valve regurgitation    ? On apixaban therapy    ? Parkinson's disease (tremor, stiffness, slow motion, unstable posture) (HCC)    ? Hypothyroidism, unspecified type    ? Alzheimer disease (HCC)    ? Primary osteoarthritis involving multiple joints    ? Tachycardia    ? At risk for falls        Assessment and Plan     In summary: This is an 87 year old white female who presents with the following cardiovascular/clinical issues:    1.  Permanent atrial fibrillation?patient is anticoagulated with apixaban, she is on AVN blocking agent, metoprolol XL 12.5 mg p.o. daily.  Patient CHA2DS2-VASc score is 4, she is in the high risk category, yearly risk of stroke is 4.0%, she is anticoagulated with apixaban  2.  Primary hypertension?not optimally controlled  3.  Asymptomatic underlying tachycardia?the atrial fibrillation is not optimally controlled in terms of the ventricular response  4.  Fall risk?this is multifactorial and secondary to advanced age, physical deconditioning, osteoarthritis, and gait instability in the setting of combined Parkinson's disease as well as osteoarthritis  5.  Parkinson's disease?patient is on carbidopa  6.  Alzheimer dementia?patient is on combined donezepil and memantine    Plan:    1.  Discontinue aspirin  2.  Increase metoprolol XL to 12.5 mg p.o. twice daily  3.  I do recommend physical therapy for overall muscle strengthening  4.  2D echo Doppler study to be performed at patient's earliest convenience  5.  Follow-up office visit in 10 to 12 months.    Total Time Today was 30 minutes in the following activities: Preparing to see the patient, Obtaining and/or reviewing separately obtained history, Performing a medically appropriate examination and/or evaluation, Counseling and educating the patient/family/caregiver, Ordering medications, tests, or procedures, Referring and communication with other health care professionals (when not separately reported), Documenting clinical information in  the electronic or other health record, Independently interpreting results (not separately reported) and communicating results to the patient/family/caregiver and Care coordination (not separately reported)         Current Medications (including today's revisions)  ? acetaminophen SR (TYLENOL) 650 mg tablet Take 650 mg by mouth three times daily.   ? apixaban (ELIQUIS) 5 mg tablet Take one tablet by mouth twice daily.   ? aspirin 81 mg chewable tablet Take 81 mg by mouth daily.     ? calcium carbonate/vitamin D-3 (OSCAL-500+D) 1250 mg/200 unit tablet Take 2 tablets by mouth daily. Calcium Carb 1250mg  delivers 500mg  elemental Ca    ? carbidopa/levodopa (SINEMET) 25/100 mg tablet Take 1 tablet by mouth three times daily.   ? cholecalciferol (VITAMIN D-3) 5000 unit tablet Take 5,000 Units by mouth daily.   ? diazePAM (VALIUM) 5 mg tablet Take 5 mg by mouth once.   ? Docusate Sodium 100 mg tab Take 100 mg by mouth twice daily as needed.   ? donepezil (ARICEPT) 10 mg tablet Take 10 mg by mouth at bedtime daily.   ? gabapentin (NEURONTIN) 300 mg capsule Take 300 mg by mouth daily.   ? glucosamine 500 mg tab Take 1,500 mg by mouth daily with breakfast.   ? levothyroxine (SYNTHROID) 75 mcg tablet Take 75 mcg by mouth daily 30 minutes before breakfast.   ? memantine (NAMENDA) 5 mg tablet Take 5 mg by mouth twice daily.   ? methylPREDNISolone (MEDROL) 4 mg tablet Take 4 mg by mouth every morning. Take with food.   ? metoprolol XL (TOPROL XL) 25 mg extended release tablet Take 12.5 mg by mouth daily.   ? potassium chloride SR (K-DUR) 20 mEq tablet Take 20 mEq by mouth daily. Take with a meal and a full glass of water.   ? primidone (MYSOLINE) 50 mg tablet Take 50 mg by mouth daily.   ? rOPINIRole (REQUIP) 0.25 mg tablet Take 0.25 mg by mouth four times daily.   ? rOPINIRole (REQUIP) 1 mg tablet Take 1 mg by mouth four times daily.   ? senna/docusate (SENOKOT-S) 8.6/50 mg tablet Take 1 tablet by mouth twice daily.   ? simvastatin (ZOCOR) 20 mg tablet Take 20 mg by mouth at bedtime daily.     ? torsemide(+) (DEMADEX) 20 mg tablet Take 40 mg by mouth daily.   ? traMADoL (ULTRAM) 50 mg tablet Take 50 mg by mouth every 8 hours as needed for Pain.   ? Zinc 50 mg tab Take 1 tablet by mouth daily.

## 2020-11-29 NOTE — Patient Instructions
Thank you for visiting our office today.    We would like to make the following medication adjustments:    Increase metoprolol to 12.5 mg twice daily  Discontinue taking aspirin       Otherwise continue the same medications as you have been doing.          We will be pursuing the following tests after your appointment today:       Orders Placed This Encounter    [facility] Today (all locations)    metoprolol XL (TOPROL XL) 25 mg extended release tablet         We will plan to see you back in 1 year.  Please call us in the meantime with any questions or concerns.        Please allow 5-7 business days for our providers to review your results. All normal results will go to MyChart. If you do not have Mychart, it is strongly recommended to get this so you can easily view all your results. If you do not have mychart, we will attempt to call you once with normal lab and testing results. If we cannot reach you by phone with normal results, we will send you a letter.  If you have not heard the results of your testing after one week please give [place] a call.       Your Cardiovascular Medicine [place] [facility] Team Brett Canales, Pilar Jarvis and [place])  phone number is [phone].

## 2020-12-03 ENCOUNTER — Encounter: Admit: 2020-12-03 | Discharge: 2020-12-03 | Payer: MEDICARE

## 2020-12-03 ENCOUNTER — Ambulatory Visit: Admit: 2020-12-03 | Discharge: 2020-12-03 | Payer: MEDICARE

## 2020-12-03 DIAGNOSIS — I10 Essential (primary) hypertension: Secondary | ICD-10-CM

## 2020-12-03 DIAGNOSIS — I4821 Permanent atrial fibrillation: Secondary | ICD-10-CM

## 2020-12-03 IMAGING — MR Pelvis^ROUTINE
6 of 9 series · 22 of 48 positions shown · non-contrast
Comparison: none

[Series 3: T2 fat-sat · axial · 8.0mm · 1.09mm/px · z∈[-107,+38]mm · 3 of 9 slices shown]
[im 1/9]
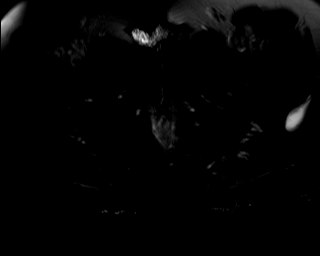
[im 5/9]
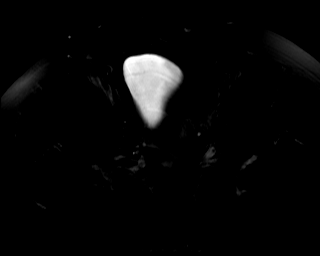
[im 9/9]
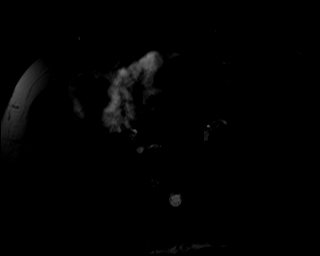

[Series 4: T1 · axial · 8.0mm · 0.68mm/px · z∈[-124,+47]mm · 4 of 11 slices shown (1 of 3)]
[im 1/11]
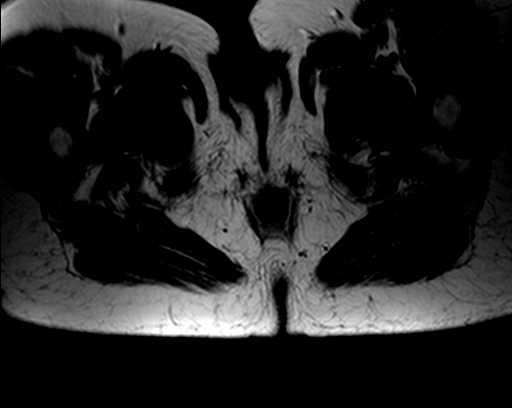
[im 4/11]
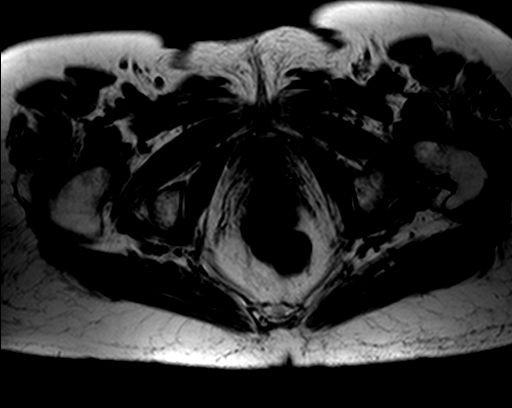
[im 7/11]
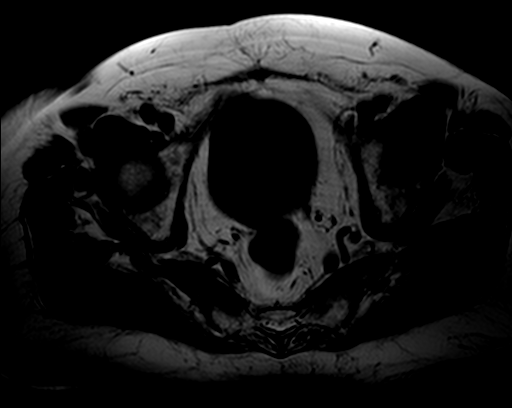
[im 11/11]
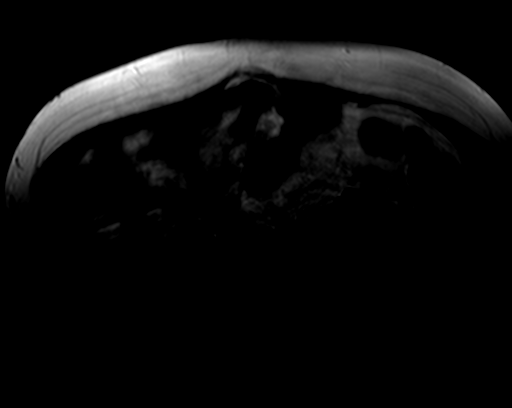

[Series 5: T1 · coronal · 8.0mm · 0.74mm/px · 6 of 16 slices shown (2 of 3)]
[im 1/16]
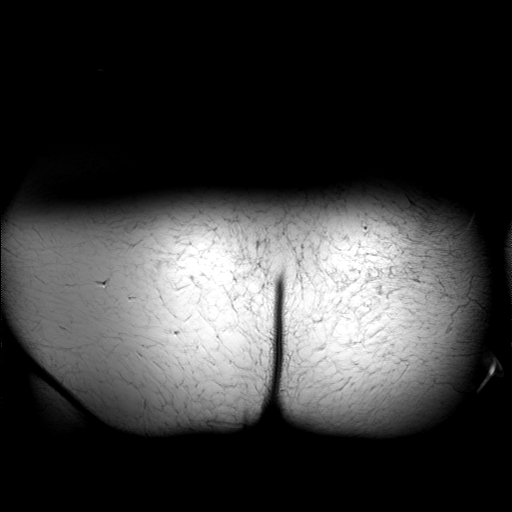
[im 4/16]
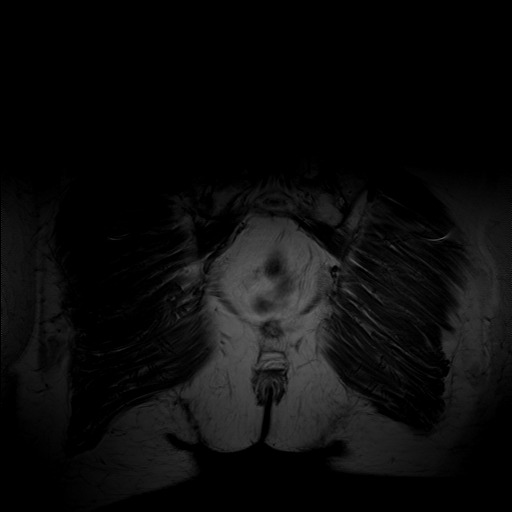
[im 7/16]
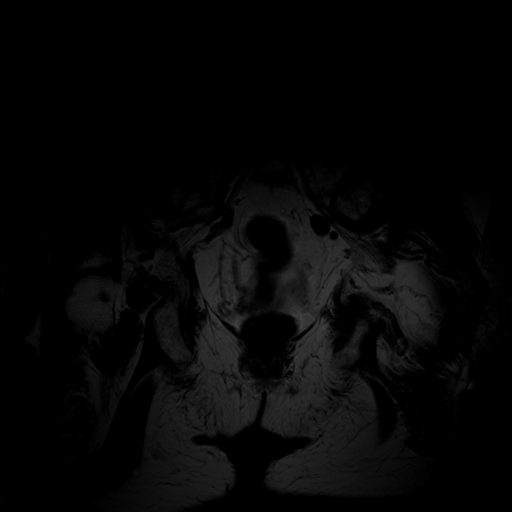
[im 10/16]
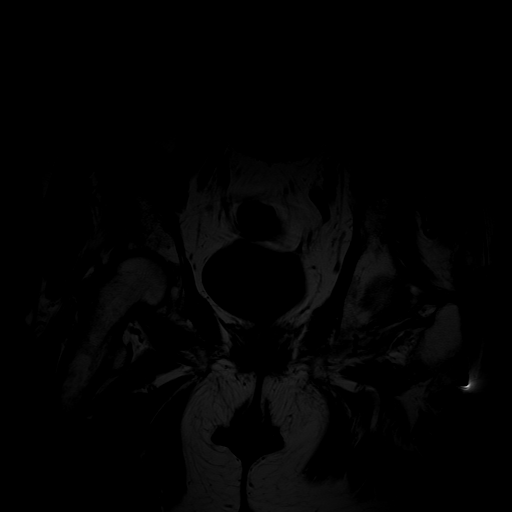
[im 13/16]
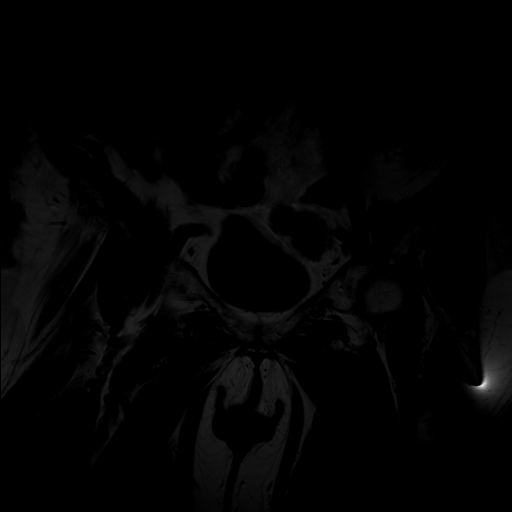
[im 16/16]
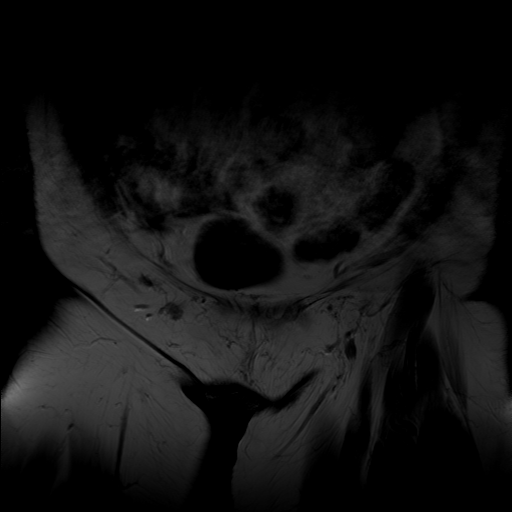

[Series 6: t1_tirm_cor · coronal · 8.0mm · 0.74mm/px · 1 of 3 slices shown]
[im 1/3]
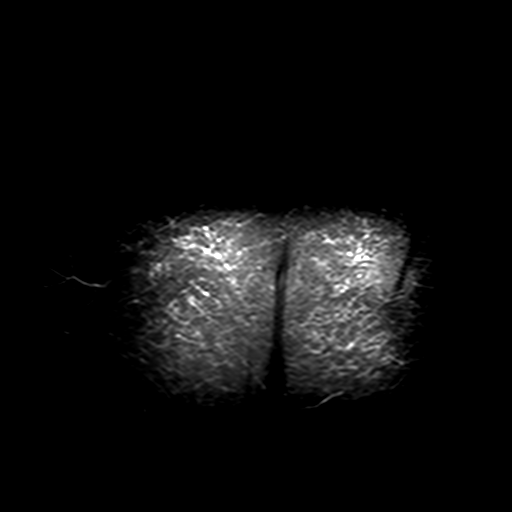

[Series 7: T1 · sagittal · 8.0mm · 0.68mm/px · 7 of 17 slices shown (3 of 3)]
[im 1/17]
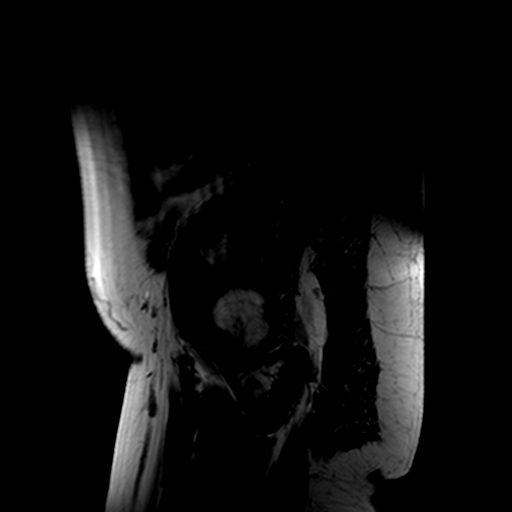
[im 3/17]
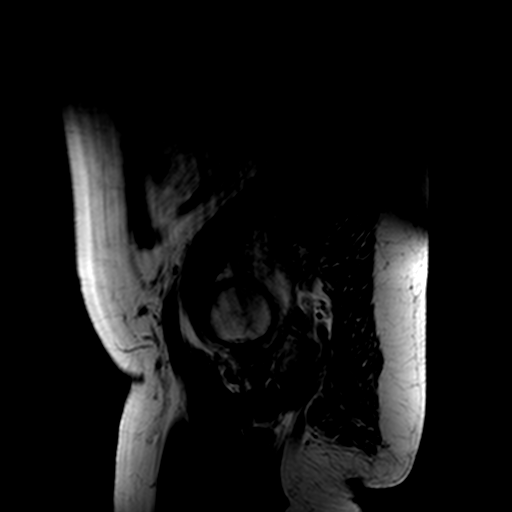
[im 6/17]
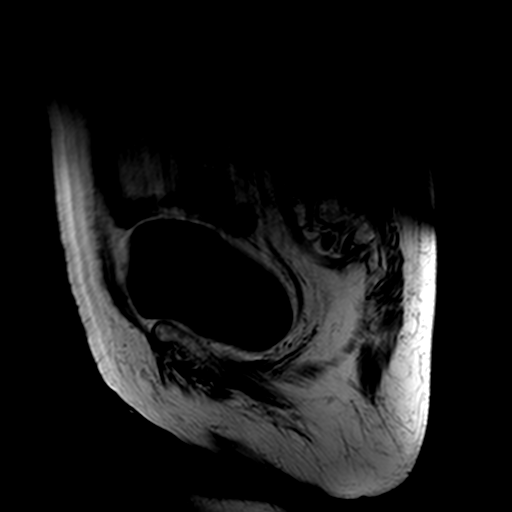
[im 9/17]
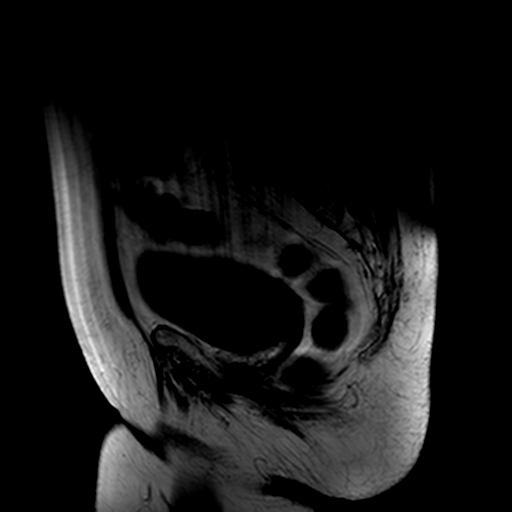
[im 11/17]
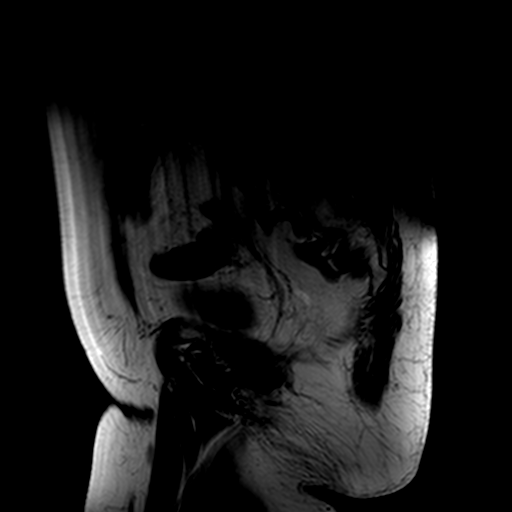
[im 14/17]
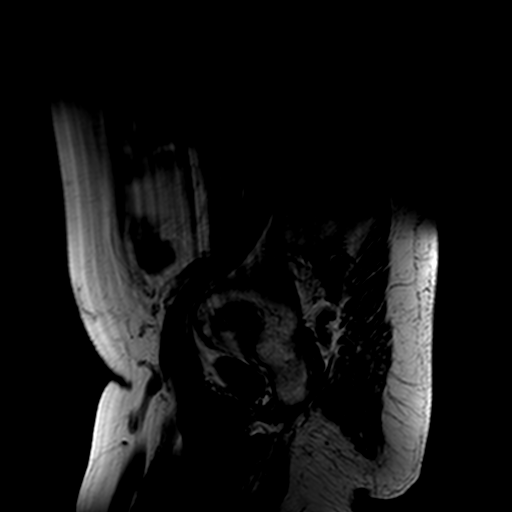
[im 17/17]
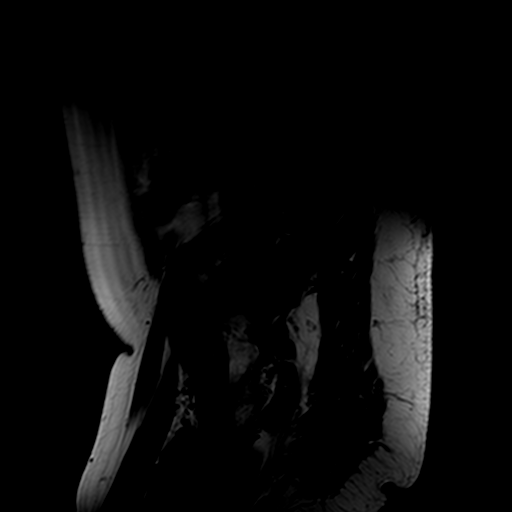

[Series 8: T1 fat-sat · axial · 8.0mm · 0.68mm/px · 1 of 18 slices shown]
[im 1/18]
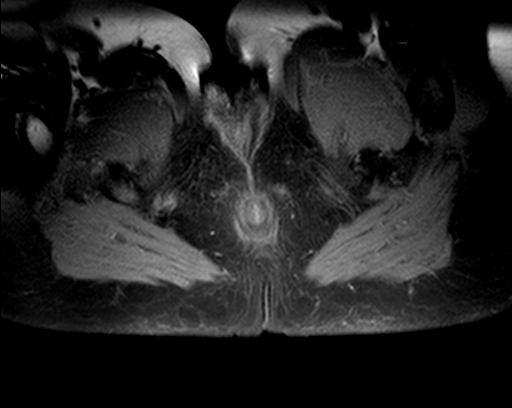

[22 of 48 positions shown; findings below may reference images not displayed]

EXAM

MR pelvis wo/w con

INDICATION

Hip pain, pelvic pain

TECHNIQUE

Multiplanar, multisequence imaging of the pelvis with and without contrast. 15 milliliters of
Gadavist contrast was administered without adverse effect

COMPARISONS

None available at the time of dictation.

FINDINGS

BONES: No acute fracture, osseous contusion, or aggressive osseous lesion. No avascular necrosis.

MUSCLES: Edema along the lateral aspect of the right gluteus maximus (series 6, image 14). Mild
edema at the distal lateral aspect of the gluteus medius (series 6, image 12).

TENDONS:Intact.

LIGAMENTS: Hip joint ligaments appear grossly intact based on the low resolution images.

JOINT SPACE/FLUID: No hip joint effusion. Large left greater trochanteric bursitis measuring up to
7.4 cm craniocaudal.

NEUROVASCULAR: Course of the proximal femoral sciatic nerves appear intact without subjacent mass
effect or edema. Partially visualized lumbosacral plexus appears intact.

OTHER: Incomplete characterization of degenerative change in the lumbosacral spine and please refer
to prior MRI. Benign-appearing left ovarian cyst.

IMPRESSION
1. Abnormal feathery muscle edema of the right lateral gluteus medius and maximus and correlate for
myositis.
2. Left greater trochanteric bursitis. This may be amenable to ultrasound-guided drainage in
steroid injection.
3. Normal course of the proximal sciatic nerves.

Tech Notes:

RIGHT HIP PAIN, RADIATION DOWN LEG, WORSE WITH LAYING ON BACK.  15 ML GADAVIST RG

## 2020-12-03 IMAGING — US ECHOCOMPL
1 series · 12 of 24 positions shown · non-contrast
Comparison: none

[Series 1: us echo 2d, complete · 41 acquisitions, 12 frames shown]
[im 2/41]
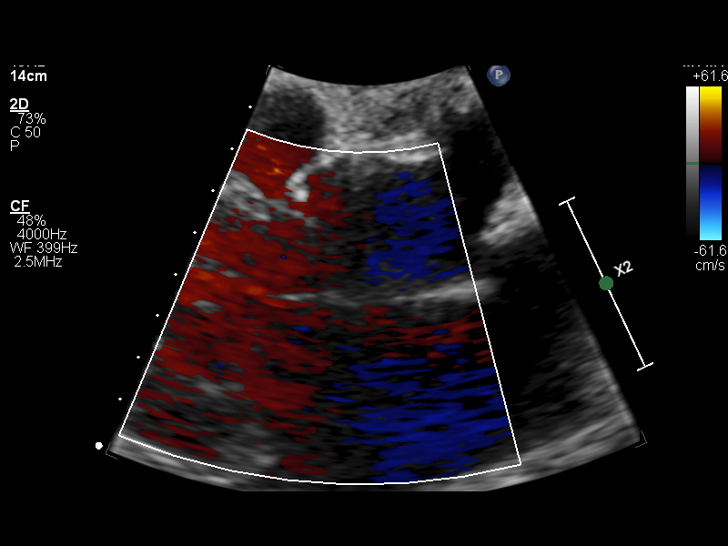
[im 6/41]
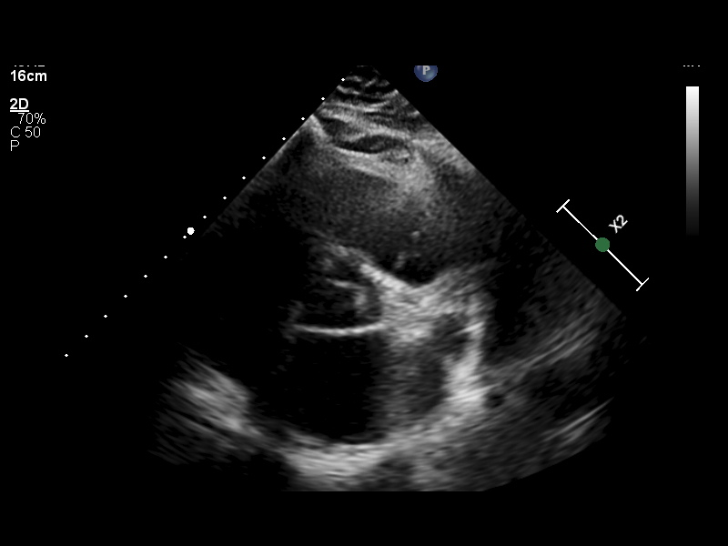
[im 7/41]
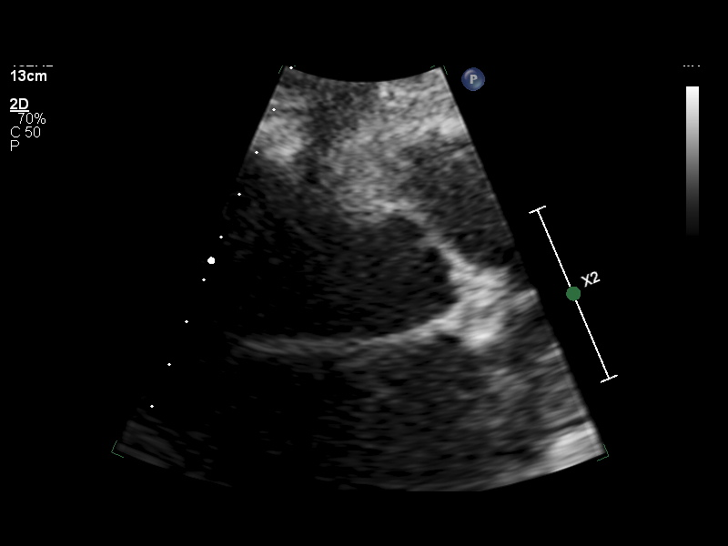
[im 11/41]
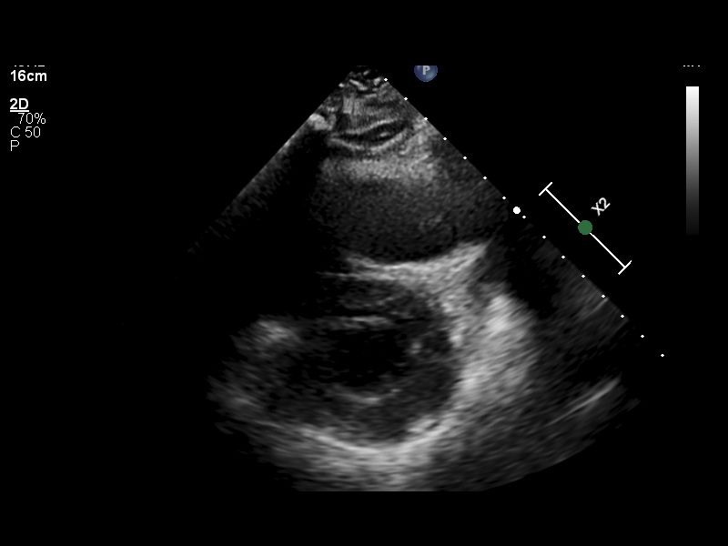
[im 14/41]
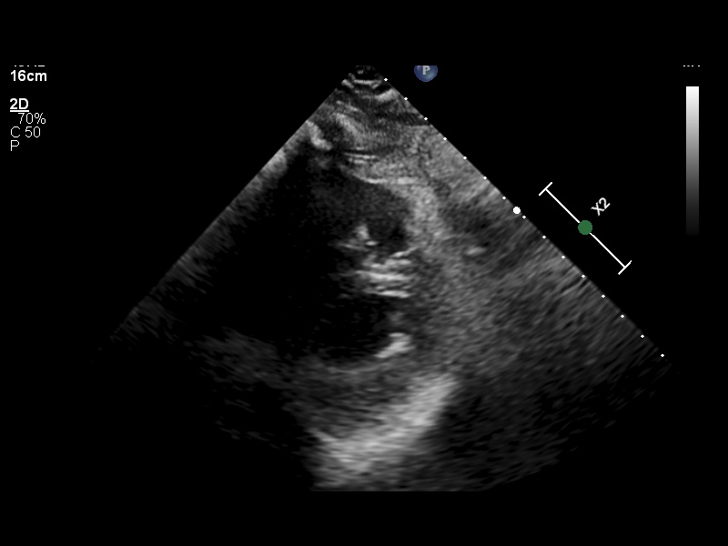
[im 16/41]
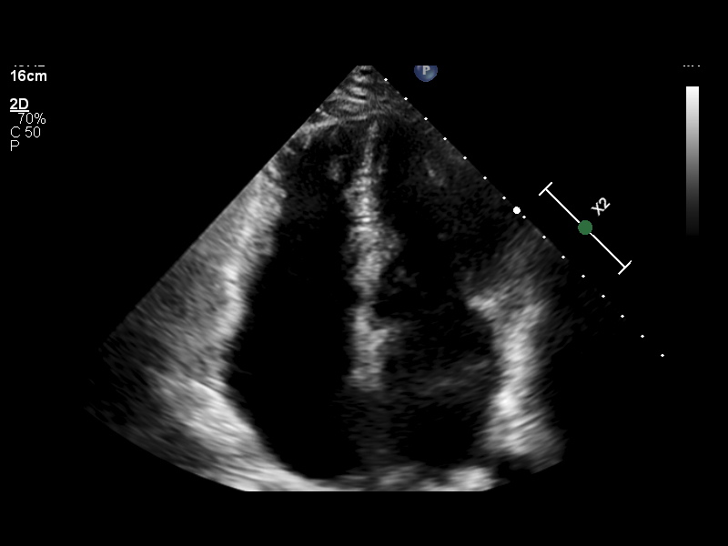
[im 20/41]
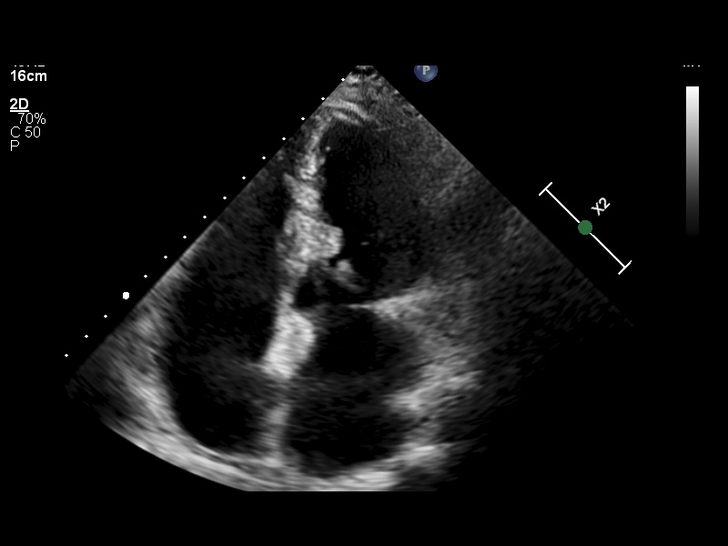
[im 23/41]
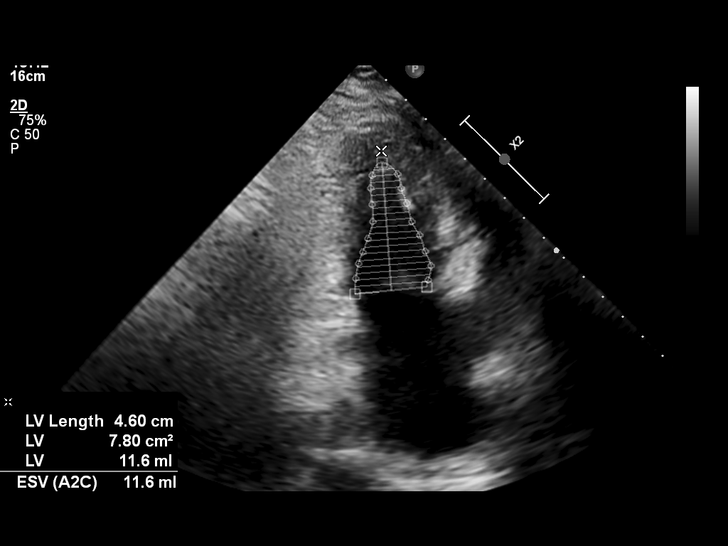
[im 28/41]
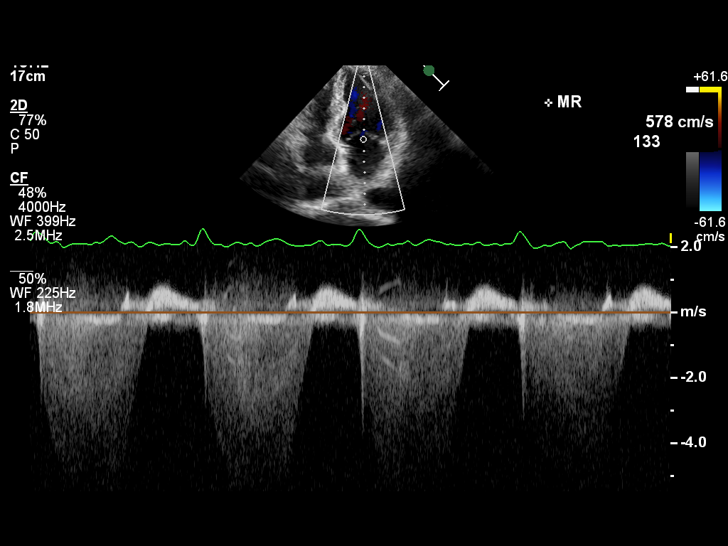
[im 34/41]
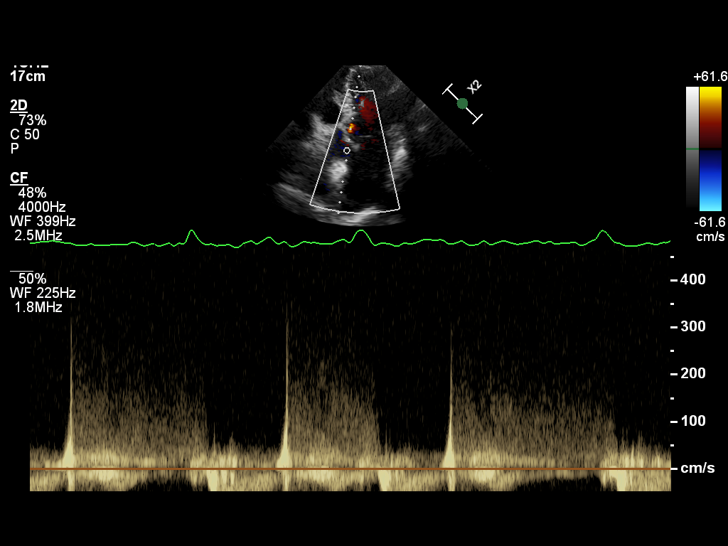
[im 37/41]
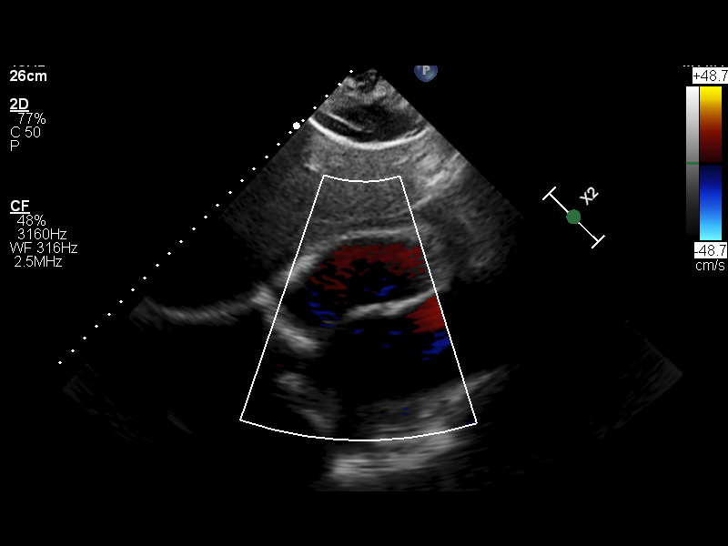
[im 41/41]
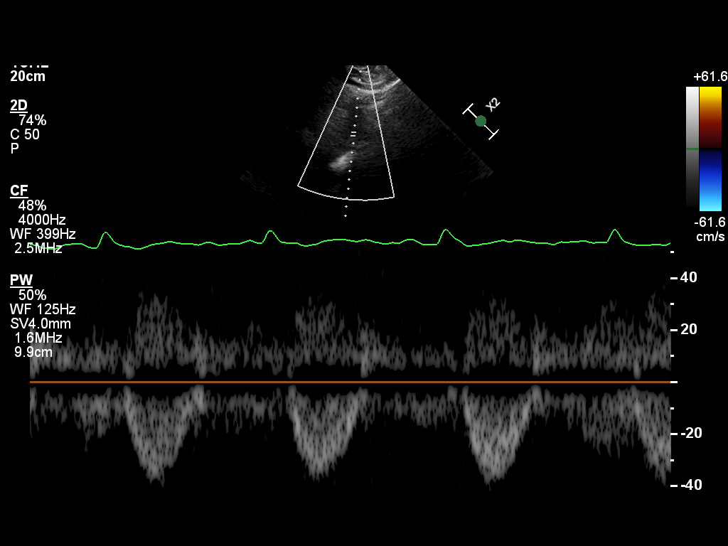

[12 of 24 positions shown; findings below may reference images not displayed]

12/03/20 -  2D + DOPPLER ECHO
Location Performed: [HOSPITAL]

Referring Provider:
Fellow:
Location of Interp:
Sonographer: External Staff

Indications:           Atrial fibrillation, HTN

Vitals
Height   Weight   BSA (Calculated)   BP   Comments
175.3 cm (5' 9")   84.4 kg (186 lb)   2.03   138/78

Interpretation Summary
The left ventricular size is normal. The left ventricular wall thickness is normal. Normal geometry.
The left ventricular systolic function is normal. The visually estimated ejection fraction is 65%.
There are no segmental wall motion abnormalities.
The right ventricle is moderately dilated. The right ventricular systolic function is normal
Right Atrium: Moderately dilated.
Tricuspid Valve: Valve has a dilated annulus. No stenosis. Severe regurgitation. Regurgitation jet
is central.
Estimated Peak Systolic PA Pressure 41 mmHg
No pericardial effusion.
Compared with study dated 12/12/19, the tricuspid regurgitation has worsened.

Echocardiographic Findings
Left Ventricle   The left ventricular size is normal. The left ventricular wall thickness is normal.
Normal geometry. The left ventricular systolic function is normal. The visually estimated ejection
fraction is 65%. There are no segmental wall motion abnormalities. Normal septal motion. Unable to
assess left ventricular diastolic function.
Right Ventricle   The right ventricle is moderately dilated. The right ventricular systolic function
is normal.
Left Atrium   Normal size.
Right Atrium   Moderately dilated.
IVC/SVC   Normal central venous pressure (0-5 mm Hg).
Mitral Valve   Normal valve structure. No stenosis. Mild to moderate regurgitation.
Tricuspid Valve   Valve has a dilated annulus. No stenosis. Severe regurgitation. Regurgitation jet
is central.
Aortic Valve   Normal valve structure. No stenosis. No regurgitation.
Pulmonary   The pulmonic valve was not seen well but no Doppler evidence of stenosis.
Aorta   The aortic root and ascending aorta are normal in size.
Pericardium   No pericardial effusion.

Left Ventricular Wall Scoring
Resting   Score Index: 1.000   Percent Normal: 100.0%

The left ventricular wall motion is normal.

Left Heart 2D Measurements (Normal Ranges)
EF (Visual)
65 %
LVIDD
4.2 cm (Range: 3.8 - 5.2)
LVIDS
2.5 cm (Range: 2.2 - 3.5)
IVS
0.9 cm (Range: 0.6 - 0.9)
LV PW
1.2 cm (Range: 0.6 - 0.9)
LA Size
4.1 cm (Range: 2.7 - 3.8)

Right Heart 2D   M-Mode Measurements (Normal Ranges) (Range)
RV Basal Dia
3.9 cm (2.5 - 4.1)
RV Mid Dia
2.9 cm (1.9 - 3.5)
[place]
29.0 cm2 (<18)
M-Mode TAPSE
1.4 cm (>1.7)

Left Heart 2D Addnl Measurements (Normal Ranges)
LV Systolic Vol
14 mL (Range: 14 - 42)
LV Systolic Vol Index
7 mL (Range: 8 - 24)
LV Diastolic Vol
43 mL (Range: 46 - 106)
LV Diastolic Vol Index
21 mL (Range: 29 - 61)
LA Vol
33 mL (Range: 22 - 52)
LA Vol Index
16.26 (Range: 16 - 34)
LV Mass
147 g (Range: 67 - 162)
LV Mass Index
72 g/m2 (Range: 43 - 95)
RWT
0.57 (Range: <=0.42)

Doppler (Spectral and Color Flow)
Estimated Peak Systolic PA Pressure
Aortic valve peak velocity
0.9 m/s

Tech Notes:

## 2020-12-04 ENCOUNTER — Encounter: Admit: 2020-12-04 | Discharge: 2020-12-04 | Payer: MEDICARE

## 2020-12-04 NOTE — Telephone Encounter
Called patient's listed primary number. The patient resides at [facility]. Discussed echo results with Deb,RN and scheduled appointment date and time for patient to see Dr. Avie Arenas in the [place] office to discuss results and treatment options.

## 2020-12-12 ENCOUNTER — Encounter: Admit: 2020-12-12 | Discharge: 2020-12-12 | Payer: MEDICARE

## 2020-12-12 NOTE — Telephone Encounter
Received call from Melissa, RN with [facility] [phone], fax [phone])requesting clearance to hold Eliquis for 3 days prior to an epidural injection.     Pt is on Eliquis for atrial fibrillation, CHADSVASC score 4.      Will discuss with [facility] in clinic.

## 2020-12-18 IMAGING — RF FL guided spine inject
1 series · 5 of 5 positions shown · non-contrast
Comparison: none

[Series 1: run · 2 acquisitions, 5 frames shown]
[im 1/2]
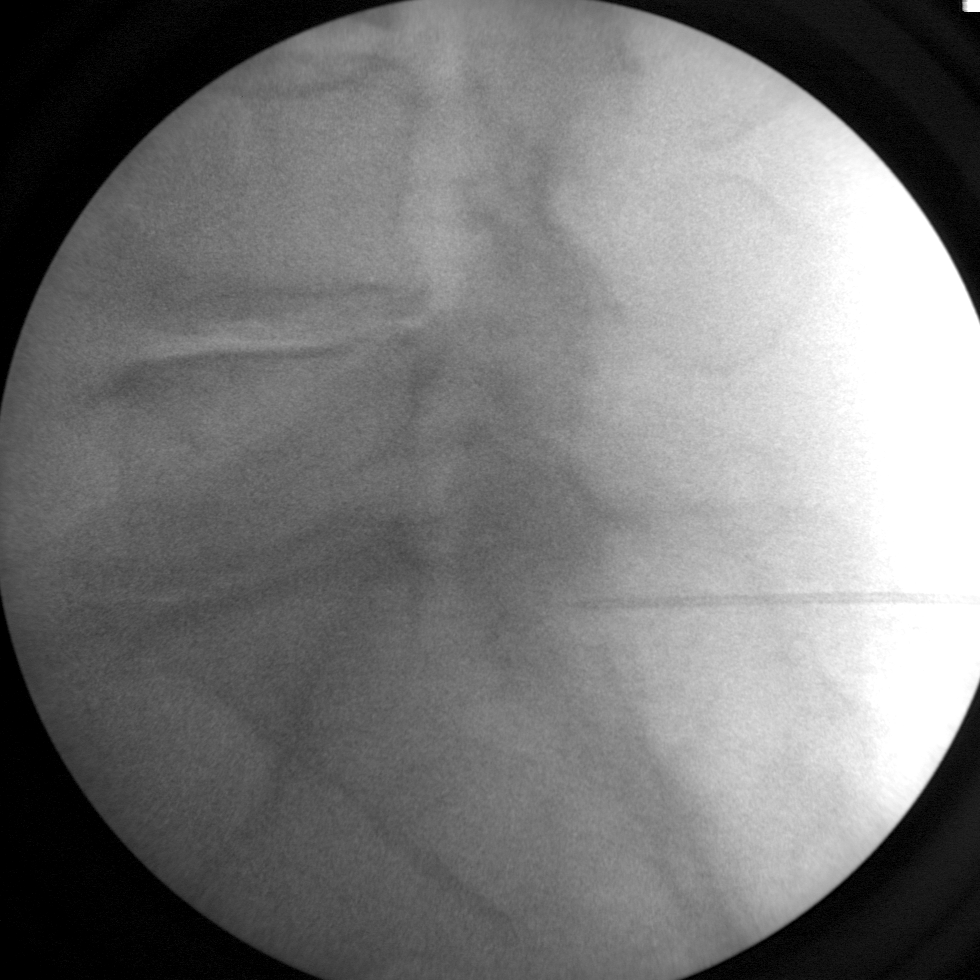
[im 2/2]
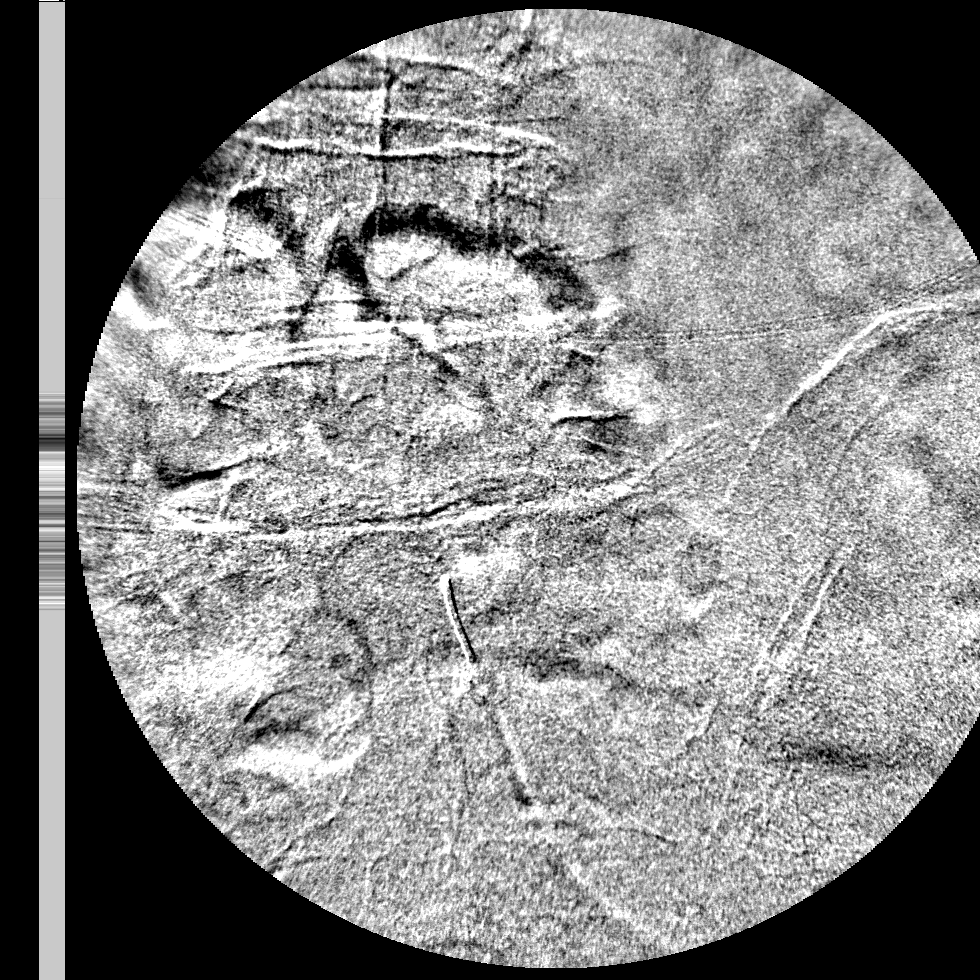
[im 2/2]
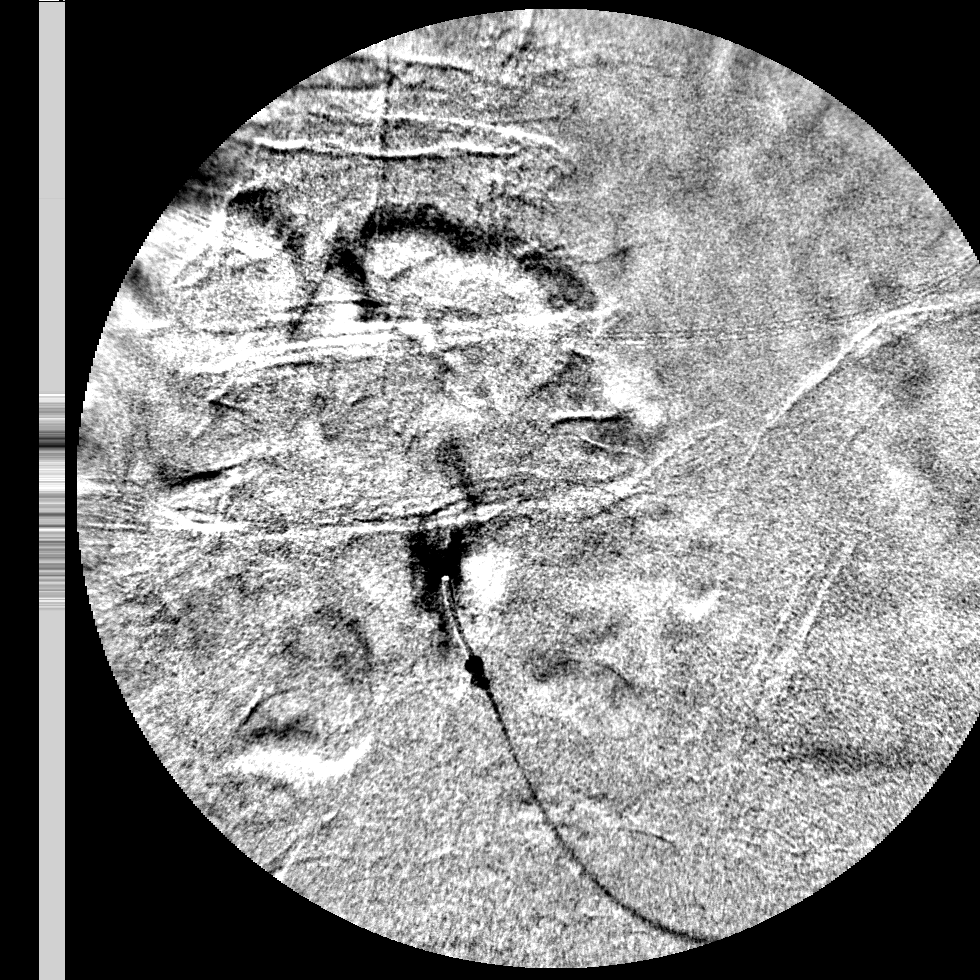
[im 2/2]
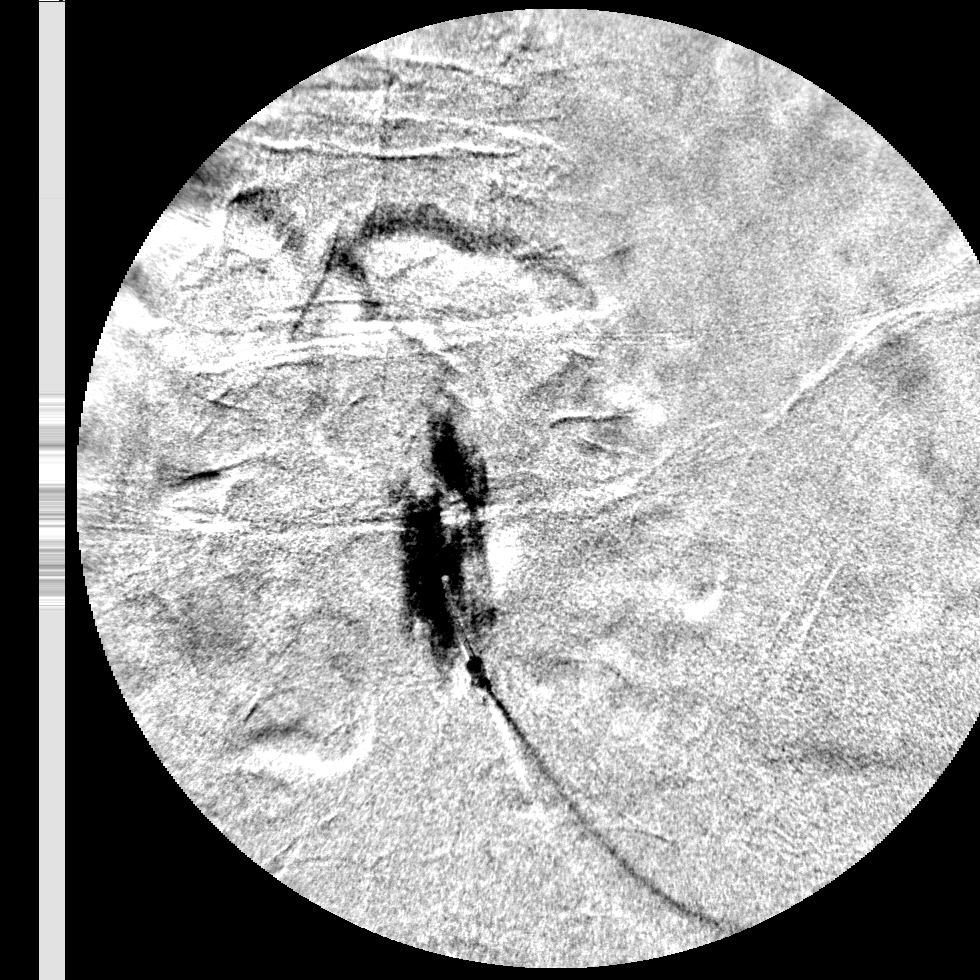
[im 2/2]
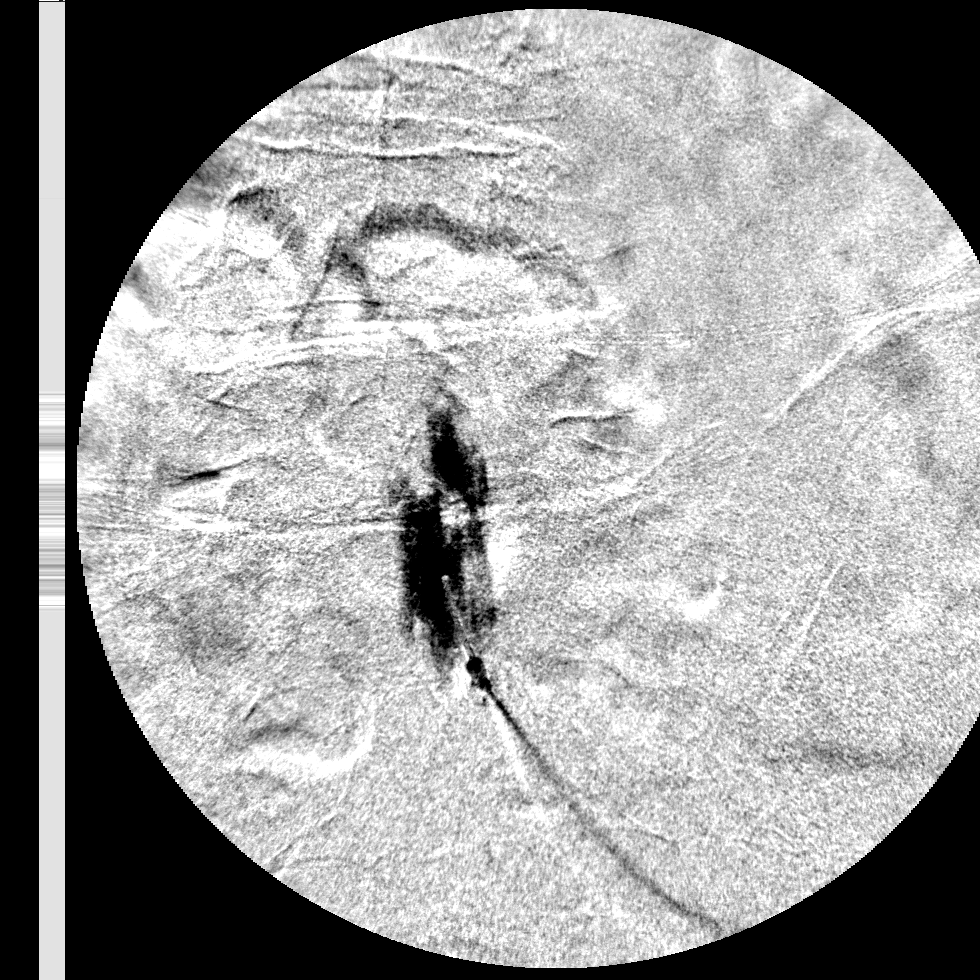

[5 of 5 positions shown; findings below may reference images not displayed]

DIAGNOSTIC STUDIES

EXAM

Fluoroscopic guidance for spinal injection.

INDICATION

POZISA
lumbar epidural spinal injection, 6.8 mGy, 19 seconds

TECHNIQUE

Fluoro time is 19 seconds. Cine clip and 2 spot films were obtained.

COMPARISONS

None available

FINDINGS

Fluoroscopic guidance was provided for spinal injection. Please see procedure note for full
details.

IMPRESSION

Fluoroscopic guidance for spinal injection.

Tech Notes:

lumbar epidural spinal injection, 6.8 mGy, 19 seconds

## 2020-12-25 ENCOUNTER — Encounter: Admit: 2020-12-25 | Discharge: 2020-12-25 | Payer: MEDICARE

## 2020-12-25 DIAGNOSIS — I4891 Unspecified atrial fibrillation: Secondary | ICD-10-CM

## 2020-12-25 DIAGNOSIS — I071 Rheumatic tricuspid insufficiency: Secondary | ICD-10-CM

## 2020-12-25 DIAGNOSIS — G20 Parkinson's disease: Secondary | ICD-10-CM

## 2020-12-25 DIAGNOSIS — E039 Hypothyroidism, unspecified: Secondary | ICD-10-CM

## 2020-12-25 DIAGNOSIS — M159 Polyosteoarthritis, unspecified: Secondary | ICD-10-CM

## 2020-12-25 DIAGNOSIS — Z9181 History of falling: Secondary | ICD-10-CM

## 2020-12-25 DIAGNOSIS — E785 Hyperlipidemia, unspecified: Secondary | ICD-10-CM

## 2020-12-25 DIAGNOSIS — R000 Tachycardia, unspecified: Secondary | ICD-10-CM

## 2020-12-25 DIAGNOSIS — I4821 Permanent atrial fibrillation: Secondary | ICD-10-CM

## 2020-12-25 DIAGNOSIS — Z7901 Long term (current) use of anticoagulants: Secondary | ICD-10-CM

## 2020-12-25 DIAGNOSIS — I10 Essential (primary) hypertension: Secondary | ICD-10-CM

## 2020-12-25 DIAGNOSIS — G309 Alzheimer's disease, unspecified: Secondary | ICD-10-CM

## 2020-12-25 DIAGNOSIS — E782 Mixed hyperlipidemia: Secondary | ICD-10-CM

## 2020-12-25 DIAGNOSIS — R931 Abnormal findings on diagnostic imaging of heart and coronary circulation: Secondary | ICD-10-CM

## 2020-12-25 NOTE — Patient Instructions
Referral to valve clinic   Follow up in 6-9 months      Follow up as directed.  Call sooner if issues.  Call the [place] nursing line at [phone].  Leave a detailed message for the nurse in [place] with how we can assist you and we will call you back.

## 2020-12-25 NOTE — Progress Notes
Date of Service: 12/25/2020    [facility] Melinda Mcdaniel is a 87 y.o. female.       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  Patient has a history of permanent atrial fibrillation, her rate is slightly elevated at 102 bpm, she is on anticoagulation with apixaban, patient also has Parkinson's disease, Alzheimer's dementia, neuropathy currently treated with gabapentin, hypothyroidism and a known history of tricuspid valve regurgitation.    She was evaluated with a 2D echo Doppler study on 12/03/2020, the study was reviewed by me as well, it demonstrated a normal systolic function with an ejection fraction of 65%, the RV was dilated with preserved systolic function, subsequently there is dilated tricuspid valve annulus with severe regurgitation, and pulmonary hypertension, by the study PAP = 41 mmHg.    Patient came to the office today accompanied by another sister to discuss the results of this echocardiogram.    She does not have any symptoms of congestive heart failure.    She is able to walk without any assistance, she does not use a cane or walker and she is exercising every day.  There are no symptoms of congestive heart failure.    Patient is not compliant with a low-sodium diet.    The main issues are related to Parkinson's disease with very prominent intentional tremor as well as Alzheimer's dementia, both these neurological disorders are currently treated.    Patient does not have any documented history of CAD and does not have symptoms compatible with angina.         Vitals:    12/25/20 0948   BP: 118/68   BP Source: Arm, Left Upper   Pulse: 102   SpO2: 95%   O2 Device: None (Room air)   PainSc: Zero   Weight: 87 kg (191 lb 12.8 oz)   Height: 175.3 cm (5' 9)     Body mass index is 28.32 kg/m?Marland Kitchen     Past Medical History  Patient Active Problem List    Diagnosis Date Noted   ? Alzheimer disease (HCC) 11/29/2020   ? Primary osteoarthritis involving multiple joints 11/29/2020   ? Tachycardia 11/29/2020   ? At risk for falls 11/29/2020   ? On apixaban therapy 09/13/2019   ? Moderate tricuspid valve regurgitation 05/03/2019   ? Parkinson's disease (tremor, stiffness, slow motion, unstable posture) (HCC) 05/20/2016   ? Essential hypertension 06/06/2014   ? Atrial fibrillation (HCC) 12/16/2011   ? Hypothyroid 12/16/2011   ? Hyperlipemia 12/16/2011         Review of Systems   Constitutional: Negative.   HENT: Negative.    Eyes: Negative.    Cardiovascular: Negative.    Respiratory: Negative.    Endocrine: Negative.    Hematologic/Lymphatic: Negative.    Skin: Negative.    Musculoskeletal: Positive for back pain.   Gastrointestinal: Negative.    Genitourinary: Negative.    Neurological: Negative.    Psychiatric/Behavioral: Negative.    Allergic/Immunologic: Negative.        Physical Exam  General Appearance: normal in appearance  Skin: warm, moist, no ulcers or xanthomas  Eyes: conjunctivae and lids normal, pupils are equal and round  Lips & Oral Mucosa: no pallor or cyanosis  Neck Veins: neck veins are flat, neck veins are not distended  Chest Inspection: chest is normal in appearance  Respiratory Effort: breathing comfortably, no respiratory distress  Auscultation/Percussion: lungs clear to auscultation, no rales or rhonchi, no wheezing  Cardiac Rhythm: irregularly irregular rhythm and normal rate  Cardiac Auscultation: S1, S2 normal, no rub, no gallop  Murmurs: no murmur  Carotid Arteries: normal carotid upstroke bilaterally, no bruit  Abdominal Aorta: no abdominal aortic bruit  Lower Extremity Edema: no lower extremity edema  Abdominal Exam: soft, non-tender, no masses, bowel sounds normal  Liver & Spleen: no organomegaly  Neurologic Exam: neurological assessment grossly intact         Cardiovascular Studies  Twelve-lead EKG demonstrates atrial fibrillation, ventricular rate 95 bpm, incomplete right axis deviation, nonspecific ST segment changes    Cardiovascular Health Factors  Vitals BP Readings from Last 3 Encounters:   12/25/20 118/68   12/03/20 138/78   11/29/20 138/78     Wt Readings from Last 3 Encounters:   12/25/20 87 kg (191 lb 12.8 oz)   12/03/20 84.4 kg (186 lb)   11/29/20 84.4 kg (186 lb)     BMI Readings from Last 3 Encounters:   12/25/20 28.32 kg/m?   12/03/20 27.47 kg/m?   11/29/20 27.47 kg/m?      Smoking Social History     Tobacco Use   Smoking Status Never Smoker   Smokeless Tobacco Never Used      Lipid Profile Cholesterol   Date Value Ref Range Status   02/27/2020 195  Final     HDL   Date Value Ref Range Status   02/27/2020 53  Final     LDL   Date Value Ref Range Status   02/27/2020 105 (H) <100 Final     Triglycerides   Date Value Ref Range Status   02/27/2020 187 (H) <150 Final      Blood Sugar No results found for: HGBA1C  Glucose   Date Value Ref Range Status   02/27/2020 92  Final   01/14/2019 96  Final   10/27/2018 90  Final          Problems Addressed Today  Encounter Diagnoses   Name Primary?   ? Permanent atrial fibrillation (HCC) Yes   ? Mixed hyperlipidemia    ? Essential hypertension    ? Moderate tricuspid valve regurgitation    ? Hypothyroidism, unspecified type    ? Parkinson's disease (tremor, stiffness, slow motion, unstable posture) (HCC)    ? On apixaban therapy    ? Alzheimer disease (HCC)    ? Primary osteoarthritis involving multiple joints    ? Tachycardia    ? 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  Abnormal echocardiogram  2.  Severe tricuspid valve regurgitation? this is due to dilated RV and subsequent TV annulus dilatation resulting in severe TR and probably these structural changes in this patient's heart might have occurred due to longstanding history of atrial arrhythmia and subsequent diastolic dysfunction as well as perhaps primary pulmonary issues (these have never been worked up or diagnosed).  3.  Permanent atrial fibrillation?patient is anticoagulated with apixaban, her heart rate ranges today at his office visit between 95-102 bpm, patient is currently on a beta 1 blocker  4.  Primary hypertension  5.  Parkinson's disease  6.  Alzheimer dementia    Plan:    1.  Continue all current medications  2.  Patient will be further evaluated in  the valve clinic at [facility] to discuss invasive treatment of the severe tricuspid valve regurgitation.  Alternatively, due to the fact that she is asymptomatic without any overt symptoms/findings of CHF, we can also continue to monitor this abnormal finding on the echocardiogram as well as subsequent pulmonary hypertension and discuss intervention at a later date.  Nevertheless, patient would like to have a discussion in the valve clinic and understand what other therapeutic options.      Total Time Today was 30 minutes in the following activities: Preparing to see the patient, Obtaining and/or reviewing separately obtained history, Performing a medically appropriate examination and/or evaluation, Counseling and educating the patient/family/caregiver, Ordering medications, tests, or procedures, Referring and communication with other health care professionals (when not separately reported), Documenting clinical information in the electronic or other health record, Independently interpreting results (not separately reported) and communicating results to the patient/family/caregiver and Care coordination (not separately reported)         Current Medications (including today's revisions)  ? acetaminophen SR (TYLENOL) 650 mg tablet Take 650 mg by mouth three times daily.   ? apixaban (ELIQUIS) 5 mg tablet Take one tablet by mouth twice daily.   ? calcium carbonate/vitamin D-3 (OSCAL-500+D) 1250 mg/200 unit tablet Take 2 tablets by mouth daily. Calcium Carb 1250mg  delivers 500mg  elemental Ca    ? carbidopa/levodopa (SINEMET) 25/100 mg tablet Take 1 tablet by mouth three times daily.   ? cholecalciferol (VITAMIN D-3) 5000 unit tablet Take 5,000 Units by mouth daily.   ? diazePAM (VALIUM) 5 mg tablet Take 5 mg by mouth once.   ? Docusate Sodium 100 mg tab Take 100 mg by mouth twice daily as needed.   ? donepezil (ARICEPT) 10 mg tablet Take 10 mg by mouth at bedtime daily.   ? gabapentin (NEURONTIN) 300 mg capsule Take 300 mg by mouth daily.   ? glucosamine 500 mg tab Take 1,500 mg by mouth daily with breakfast.   ? levothyroxine (SYNTHROID) 75 mcg tablet Take 75 mcg by mouth daily 30 minutes before breakfast.   ? memantine (NAMENDA) 5 mg tablet Take 5 mg by mouth twice daily.   ? methylPREDNISolone (MEDROL) 4 mg tablet Take 4 mg by mouth every morning. Take with food.   ? metoprolol XL (TOPROL XL) 25 mg extended release tablet Take one-half tablet by mouth twice daily.   ? potassium chloride SR (K-DUR) 20 mEq tablet Take 20 mEq by mouth daily. Take with a meal and a full glass of water.   ? predniSONE (DELTASONE) 10 mg tablet Take 5 mg by mouth daily.   ? primidone (MYSOLINE) 50 mg tablet Take 50 mg by mouth daily.   ? rOPINIRole (REQUIP) 0.25 mg tablet Take 0.25 mg by mouth four times daily.   ? rOPINIRole (REQUIP) 1 mg tablet Take 1 mg by mouth four times daily.   ? senna/docusate (SENOKOT-S) 8.6/50 mg tablet Take 1 tablet by mouth twice daily.   ? simvastatin (ZOCOR) 20 mg tablet Take 20 mg by mouth at bedtime daily.     ? torsemide(+) (DEMADEX) 20 mg tablet Take 40 mg by mouth daily.   ? traMADoL (ULTRAM) 50 mg tablet Take 50 mg by mouth every 8 hours as needed for Pain.   ? Zinc 50 mg tab Take 1 tablet by mouth daily.

## 2020-12-27 ENCOUNTER — Encounter: Admit: 2020-12-27 | Discharge: 2020-12-27 | Payer: MEDICARE

## 2021-09-20 ENCOUNTER — Encounter: Admit: 2021-09-20 | Discharge: 2021-09-20 | Payer: MEDICARE

## 2021-12-17 IMAGING — MR MR BRAIN^MR BRAIN W & WO
9 series · 48 of 48 positions shown · non-contrast
Comparison: none

[Series 2: T1 · sagittal · 5.0mm · 0.90mm/px · 3 of 21 slices shown (1 of 2)]
[im 1/21]
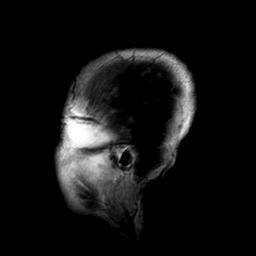
[im 11/21]
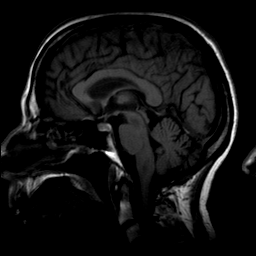
[im 21/21]
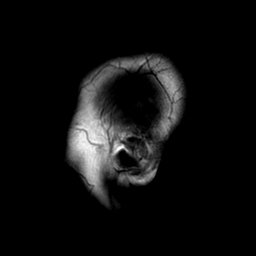

[Series 3: ep2d_diff_(id)_trace · axial · 5.0mm · 1.80mm/px · z∈[-80,+56]mm · 12 of 65 slices shown]
[im 1/65]
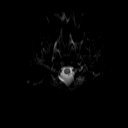
[im 6/65]
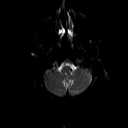
[im 12/65]
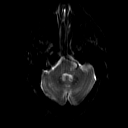
[im 18/65]
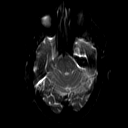
[im 24/65]
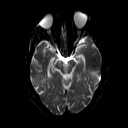
[im 30/65]
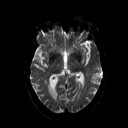
[im 35/65]
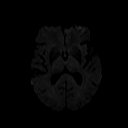
[im 41/65]
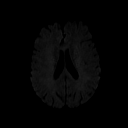
[im 47/65]
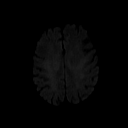
[im 53/65]
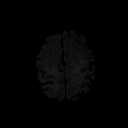
[im 59/65]
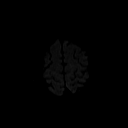
[im 65/65]
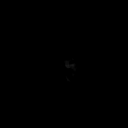

[Series 4: ep2d_diff_(id)_trace_adc · axial · 5.0mm · 1.80mm/px · z∈[-80,+56]mm · 4 of 21 slices shown]
[im 1/21]
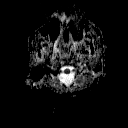
[im 7/21]
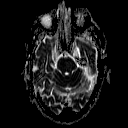
[im 14/21]
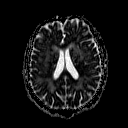
[im 21/21]
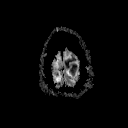

[Series 7: FLAIR · axial · 5.0mm · 0.45mm/px · z∈[-94,+61]mm · 5 of 25 slices shown (1 of 2)]
[im 1/25]
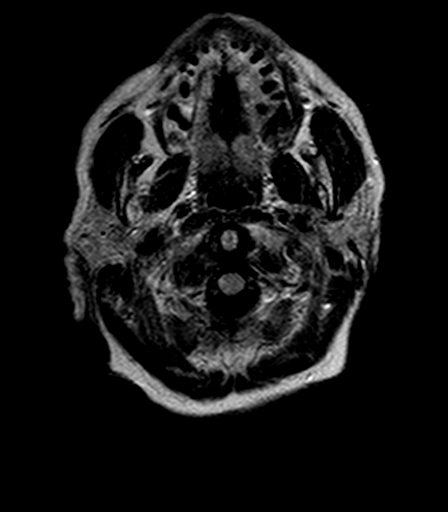
[im 7/25]
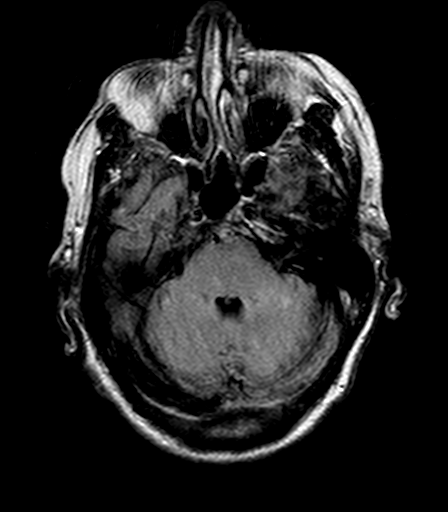
[im 13/25]
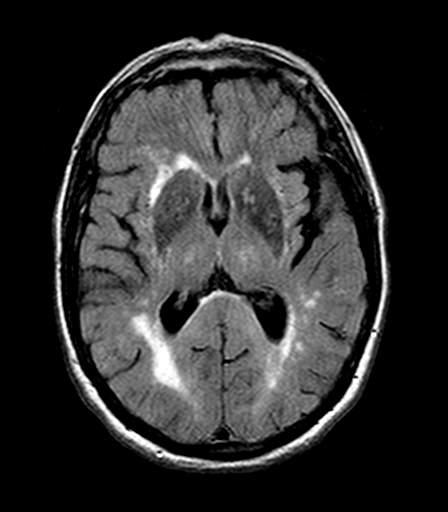
[im 19/25]
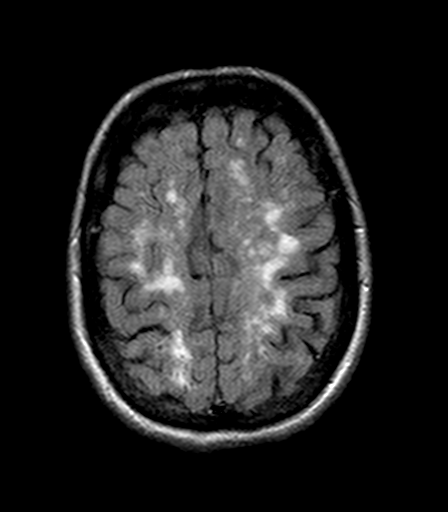
[im 25/25]
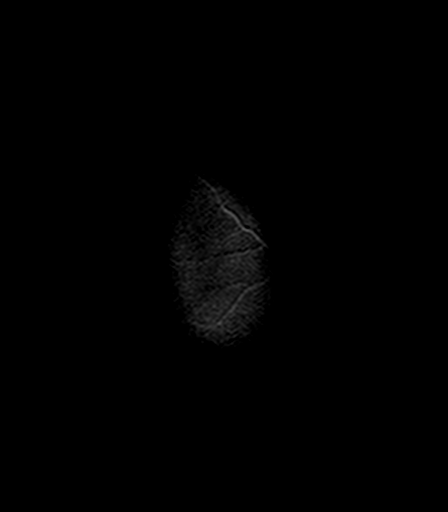

[Series 8: T2 · axial · 5.0mm · 0.45mm/px · z∈[-90,+59]mm · 5 of 24 slices shown (1 of 2)]
[im 1/24]
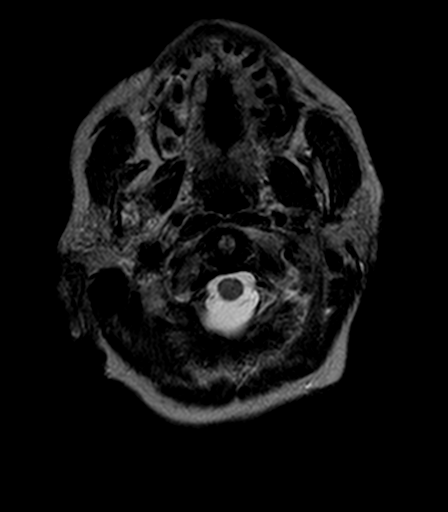
[im 6/24]
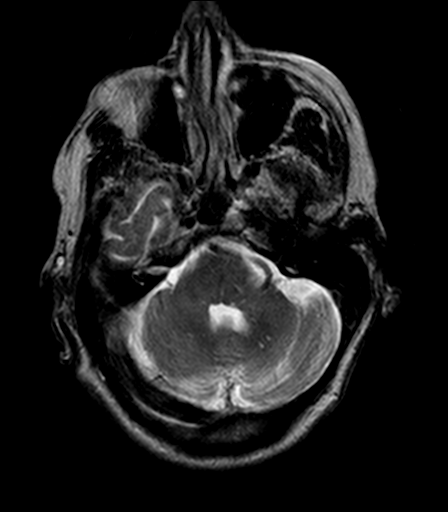
[im 12/24]
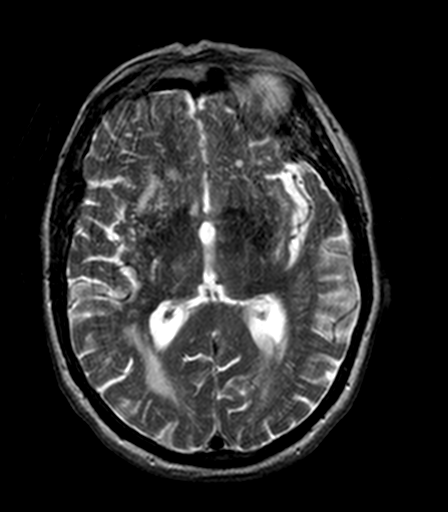
[im 18/24]
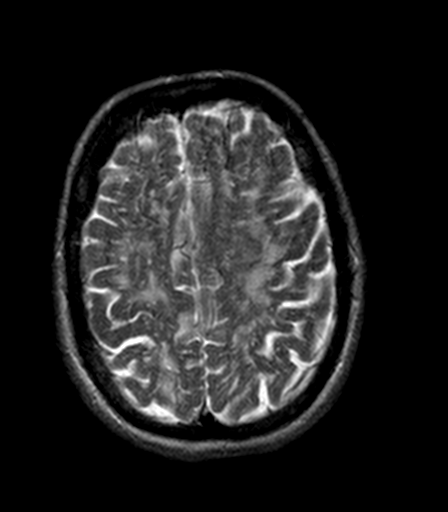
[im 24/24]
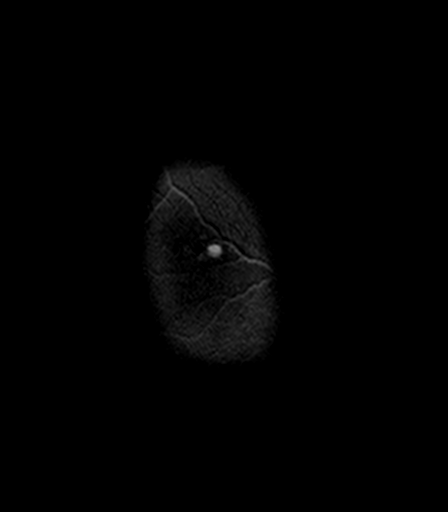

[Series 9: T1 · axial · 5.0mm · 0.90mm/px · z∈[-100,+48]mm · 5 of 24 slices shown (2 of 2)]
[im 1/24]
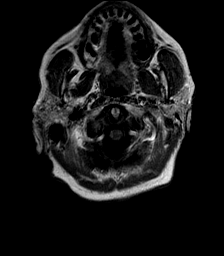
[im 6/24]
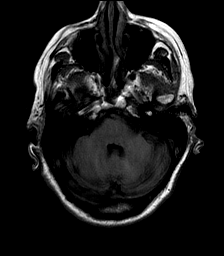
[im 12/24]
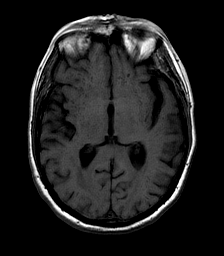
[im 18/24]
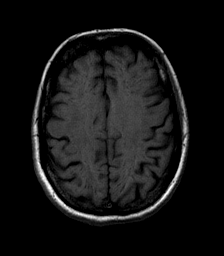
[im 24/24]
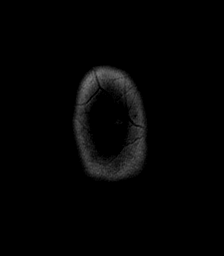

[Series 10: T2 · coronal · 5.0mm · 0.90mm/px · 5 of 26 slices shown (2 of 2)]
[im 1/26]
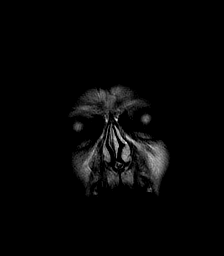
[im 7/26]
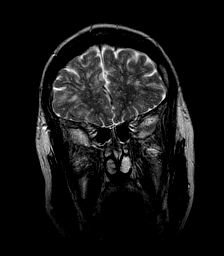
[im 13/26]
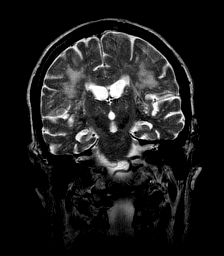
[im 19/26]
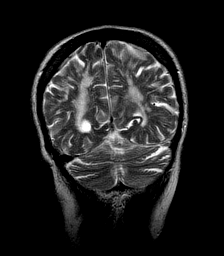
[im 26/26]
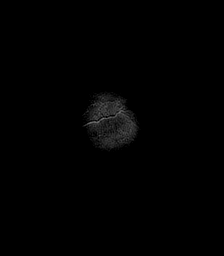

[Series 11: GRE · axial · 5.0mm · 0.78mm/px · z∈[-83,+53]mm · 4 of 22 slices shown]
[im 1/22]
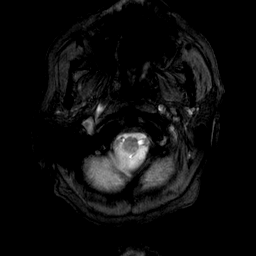
[im 8/22]
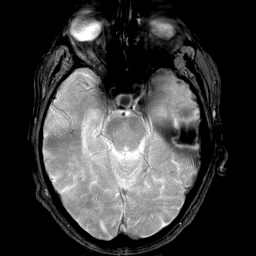
[im 15/22]
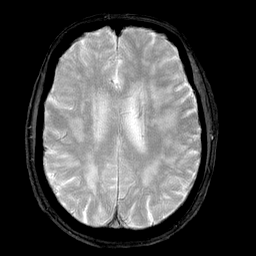
[im 22/22]
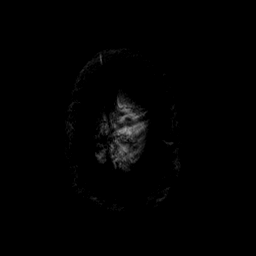

[Series 12: FLAIR · axial · 5.0mm · 0.45mm/px · z∈[-102,+51]mm · 5 of 25 slices shown (2 of 2)]
[im 1/25]
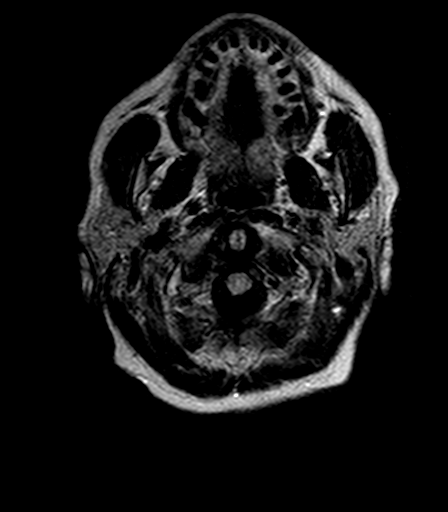
[im 7/25]
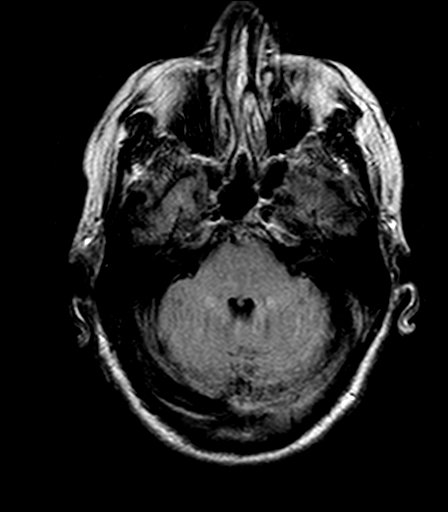
[im 13/25]
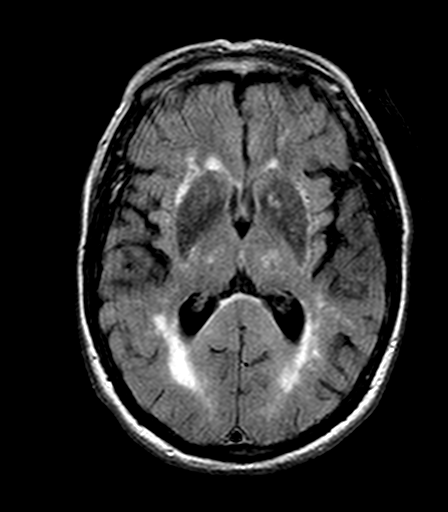
[im 19/25]
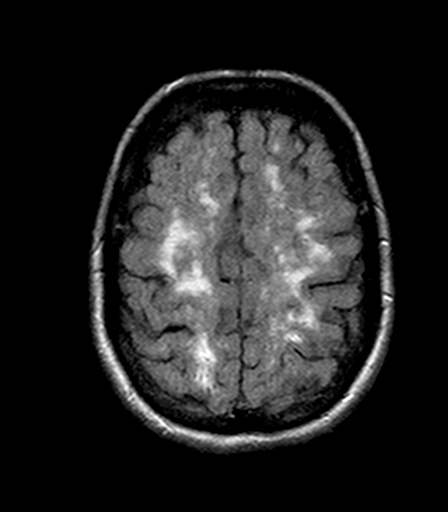
[im 25/25]
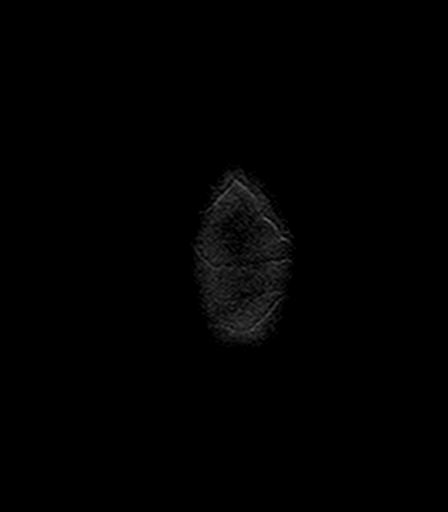

[48 of 48 positions shown; findings below may reference images not displayed]

DIAGNOSTIC STUDIES

EXAM

MRI of the brain without contrast.

INDICATION

Parkinson's disease
NEW DIAGNOSIS PARKINSONS, PT IS VERY CONFUSED, POOR HISTORIAN.

TECHNIQUE

Sagittal axial and coronal images were obtained with variable T1 and T2 weighting.

COMPARISONS

None available

FINDINGS

Degenerative changes at C1-2 are noted. Minimal T2 signal is noted throughout the pons consistent
with minimal small vessel ischemic change.

The expected signal voids are noted throughout the major intracranial vascular structures, mastoid
air cells, and paranasal sinuses. There is deviation of the nasal septum to the right.

There is extensive cortical atrophy and proportional enlargement of the ventricular system.
Widespread T2 signal is noted throughout the supratentorial white matter including the corpus
callosum. This likely reflects combination small vessel ischemic change and possible demyelinating
disease. Symptoms of multiple sclerosis should be excluded clinically. Various infectious or post
infectious processes could contribute to this appearance such as Lyme disease. Prior radiation could
also account for white matter change.

There is no evidence for acute ischemia on diffusion-weighted imaging. No intracranial hemorrhage or
mass effect is seen.

IMPRESSION

Cortical atrophy and extensive abnormal signal throughout the supratentorial white matter including
the corpus callosum. Please see above differential considerations.

No intracranial hemorrhage or mass effect. No acute ischemia is seen.

Tech Notes:

NEW DIAGNOSIS PARKINSONS, PT IS VERY CONFUSED, POOR HISTORIAN.
